# Patient Record
Sex: Female | Born: 1985 | Race: White | Hispanic: Yes | Marital: Married | Smoking: Never smoker
Health system: Southern US, Community
[De-identification: ages and names within clinical notes are randomized; demographics above are authoritative.]

## PROBLEM LIST (undated history)

## (undated) DIAGNOSIS — I10 Essential (primary) hypertension: Secondary | ICD-10-CM

## (undated) HISTORY — PX: NO PAST SURGERIES: SHX2092

## (undated) HISTORY — DX: Essential (primary) hypertension: I10

---

## 2007-04-02 ENCOUNTER — Inpatient Hospital Stay (HOSPITAL_COMMUNITY): Admission: AD | Admit: 2007-04-02 | Discharge: 2007-04-05 | Payer: Self-pay | Admitting: Obstetrics & Gynecology

## 2007-04-02 ENCOUNTER — Encounter: Payer: Self-pay | Admitting: Obstetrics & Gynecology

## 2010-08-30 NOTE — H&P (Signed)
NAMEDISNEY, RUGGIERO          ACCOUNT NO.:  [id number]   MEDICAL RECORD NO.:  [id number]          PATIENT TYPE:  INP   LOCATION:  9170                          FACILITY:  [facility]   PHYSICIAN:  Roseanna Rainbow, M.D.DATE OF BIRTH:  07/22/1985   DATE OF ADMISSION:  04/02/2007  DATE OF DISCHARGE:                              HISTORY & PHYSICAL   CHIEF COMPLAINT:  The patient is a 25 year old gravida 1, para 0, with  an estimated date of confinement of April 20, 2007 with an intrauterine  pregnancy at 37 plus weeks with pregnancy-induced hypertension for  induction of labor.   HISTORY OF PRESENT ILLNESS:  The patient has been followed initially as  an inpatient and subsequently as an outpatient for mild preeclampsia.  She denies any neurologic symptoms.  An ultrasound on November 25 at 34  weeks, 3 days, the estimated fetal weight percentile was the 59th  percentile, no previa, normal amniotic fluid index.   ALLERGIES:  No known drug allergies.   MEDICATIONS:  Please see the medication reconciliation form.   OB RISK FACTORS:  Please see the above, urinary tract infection.   PRENATAL LABS:  Platelet count 216,000, hemoglobin 12.7, hematocrit  35.1, urine culture and sensitivity no growth.  GC probe negative.  Chlamydia probe negative.  One hour GCT 90.  HIV nonreactive.  Uric acid  was 4.3 on November 18.  Serum creatinine was 0.71 on March 05, 2007,  SGOT and SGPT were normal on March 18, 2007.  Rubella, immune, RPR  nonreactive.  Blood type is 0 positive, antibody screen negative.  Sickle cell is negative.  Urine protein, 24 hour, was 70 mg per day on  March 08, 2007.  Varicella immune.   PAST GYN HISTORY:  Noncontributory.   PAST MEDICAL HISTORY:  No significant history of medical diseases.   PAST SURGICAL HISTORY:  No previous surgery.   SOCIAL HISTORY:  She does secretarial work.  She is single, living with  her significant other.  Does not give any  significant history of alcohol  usage.  Has no significant smoking history.  Denies illicit drug use.   FAMILY HISTORY:  Brain cancer, adult onset diabetes.   PHYSICAL EXAMINATION:  VITAL SIGNS:  Blood pressure 150s/100, fetal  heart tracing reassuring, tocodynamometer- regular uterine contractions  by vaginal examination per the RN.   ASSESSMENT:  1. Intrauterine pregnancy at 37 weeks with pregnancy-induced      hypertension  2. Appropriate for gestational age fetus.   PLAN:  Admission, PIH panel, induction of labor.  We will start with low-  dose Pitocin per protocol.      Roseanna Rainbow, M.D.  Electronically Signed     LAJ/MEDQ  D:  04/02/2007  T:  04/02/2007  Job:  [id number]

## 2011-01-20 LAB — COMPREHENSIVE METABOLIC PANEL
ALT: 42 — ABNORMAL HIGH
ALT: 44 — ABNORMAL HIGH
AST: 43 — ABNORMAL HIGH
AST: 52 — ABNORMAL HIGH
Albumin: 2.1 — ABNORMAL LOW
Albumin: 2.6 — ABNORMAL LOW
Alkaline Phosphatase: 172 — ABNORMAL HIGH
Alkaline Phosphatase: 218 — ABNORMAL HIGH
BUN: 5 — ABNORMAL LOW
BUN: 8
CO2: 18 — ABNORMAL LOW
CO2: 22
Calcium: 7.2 — ABNORMAL LOW
Calcium: 8.6
Chloride: 103
Chloride: 105
Creatinine, Ser: 0.74
Creatinine, Ser: 0.81
GFR calc Af Amer: >60
GFR calc Af Amer: >60
GFR calc non Af Amer: >60
GFR calc non Af Amer: >60
Glucose, Bld: 110 — ABNORMAL HIGH
Glucose, Bld: 77
Potassium: 3.3 — ABNORMAL LOW
Potassium: 3.8
Sodium: 132 — ABNORMAL LOW
Sodium: 133 — ABNORMAL LOW
Total Bilirubin: 0.7
Total Bilirubin: 0.9
Total Protein: 5.2 — ABNORMAL LOW
Total Protein: 5.7 — ABNORMAL LOW

## 2011-01-20 LAB — URINALYSIS, DIPSTICK ONLY
Bilirubin Urine: NEGATIVE
Glucose, UA: NEGATIVE
Hgb urine dipstick: NEGATIVE
Ketones, ur: NEGATIVE
Leukocytes, UA: NEGATIVE
Nitrite: NEGATIVE
Protein, ur: NEGATIVE
Specific Gravity, Urine: <1.005 — ABNORMAL LOW
Urobilinogen, UA: 0.2
pH: 7

## 2011-01-20 LAB — CBC
HCT: 30.5 — ABNORMAL LOW
HCT: 35.5 — ABNORMAL LOW
HCT: 37.1
Hemoglobin: 11.1 — ABNORMAL LOW
Hemoglobin: 12.8
Hemoglobin: 13.2
MCHC: 35.5
MCHC: 36
MCHC: 36.3 — ABNORMAL HIGH
MCV: 92.2
MCV: 92.5
MCV: 92.7
Platelets: 115 — ABNORMAL LOW
Platelets: 118 — ABNORMAL LOW
Platelets: 124 — ABNORMAL LOW
RBC: 3.29 — ABNORMAL LOW
RBC: 3.86 — ABNORMAL LOW
RBC: 4.01
RDW: 13.5
RDW: 13.6
RDW: 13.7
WBC: 10.4
WBC: 13.4 — ABNORMAL HIGH
WBC: 15.8 — ABNORMAL HIGH

## 2011-01-20 LAB — URIC ACID
Uric Acid, Serum: 5.3
Uric Acid, Serum: 5.8

## 2011-01-20 LAB — MAGNESIUM: Magnesium: 6.4

## 2011-01-20 LAB — RPR: RPR Ser Ql: NONREACTIVE

## 2011-01-20 LAB — LACTATE DEHYDROGENASE
LDH: 176
LDH: 231

## 2011-04-18 NOTE — L&D Delivery Note (Signed)
Delivery Note Progressed quickly to 9cm then complete dilation. By the time the CRNA got into the room to dose the epidural, the patient was pushing involuntarily.  At 3:38 AM a viable and healthy female Regan Rakers was delivered via  (Presentation: LOA with compound hand). No difficulty with shoulders.   APGAR: 9/9 , ; weight 5+14 .    Placenta status: spontaneous and grossly intact with 3VC.  Placenta had a circumvallate appearance and cord was short, approximately 10-12 inches. Sent to pathology.  Anesthesia:  Dry cath, not able to be dosed  Episiotomy: None Lacerations: None Suture Repair:  Est. Blood Loss (150 mL):   Mom to AICU.  Baby to nursery-stable.  [facility] 02/16/2012, 3:55 AM

## 2011-08-16 ENCOUNTER — Encounter: Payer: Self-pay | Admitting: *Deleted

## 2011-08-16 ENCOUNTER — Encounter: Payer: Self-pay | Admitting: Family

## 2011-08-16 ENCOUNTER — Ambulatory Visit (INDEPENDENT_AMBULATORY_CARE_PROVIDER_SITE_OTHER): Payer: Self-pay | Admitting: Advanced Practice Midwife

## 2011-08-16 ENCOUNTER — Telehealth: Payer: Self-pay | Admitting: *Deleted

## 2011-08-16 VITALS — BP 118/83 | Temp 97.7°F | Ht 64.0 in | Wt 134.0 lb

## 2011-08-16 DIAGNOSIS — Z3480 Encounter for supervision of other normal pregnancy, unspecified trimester: Secondary | ICD-10-CM

## 2011-08-16 DIAGNOSIS — O209 Hemorrhage in early pregnancy, unspecified: Secondary | ICD-10-CM

## 2011-08-16 DIAGNOSIS — Z3490 Encounter for supervision of normal pregnancy, unspecified, unspecified trimester: Secondary | ICD-10-CM

## 2011-08-16 DIAGNOSIS — O09299 Supervision of pregnancy with other poor reproductive or obstetric history, unspecified trimester: Secondary | ICD-10-CM

## 2011-08-16 LAB — POCT URINALYSIS DIP (DEVICE)
Bilirubin Urine: NEGATIVE
Glucose, UA: NEGATIVE mg/dL
Hgb urine dipstick: NEGATIVE
Ketones, ur: NEGATIVE mg/dL
Leukocytes, UA: NEGATIVE
Nitrite: NEGATIVE
Protein, ur: NEGATIVE mg/dL
Specific Gravity, Urine: 1.02 (ref 1.005–1.030)
Urobilinogen, UA: 0.2 mg/dL (ref 0.0–1.0)
pH: 6 (ref 5.0–8.0)

## 2011-08-16 NOTE — Telephone Encounter (Addendum)
Need to schedule first trimester screen if viability ultrasound shows viable fetus on 08/18/11. Per ultrasound review from 08/18/11 viable fetus verified. Called [facility]FM and sent order , appointment scheduled. Need to call patient  With interpreter and give appointment for MFM first screen- 08/23/11 at 0930 ( patient is self pay- explain will be billed)

## 2011-08-16 NOTE — Progress Notes (Signed)
Pulse- 92  No vaginal discharge but when wipe has like "blood tinge on tissue".  Pt informed of 25-35lb weight gain according to BMI. Education booklet given to pt.   Declines WIC info.

## 2011-08-16 NOTE — Progress Notes (Signed)
   Subjective:    Andrea Singh is a G2P0101 [redacted]w[redacted]d being seen today for her first obstetrical visit.  Her obstetrical history is significant for History of preeclampsia with 32 week delivery first pregnancy. Patient does intend to breast feed. Pregnancy history fully reviewed.  Patient reports spotting last week.  Filed Vitals:   08/16/11 0826 08/16/11 0830  BP: 118/83   Temp: 97.7 F (36.5 C)   Height:  5\' 4"  (1.626 m)  Weight: 134 lb (60.782 kg)     HISTORY: OB History    Grav Para Term Preterm Abortions TAB SAB Ect Mult Living   2 1  1      1      # Outc Date GA Lbr Len/2nd Wgt Sex Del Anes PTL Lv   1 PRE 12/08 [redacted]w[redacted]d  6lb(2.722kg) M SVD EPI Yes Yes   Comments: elevated blood sugars   2 CUR              Past Medical History  Diagnosis Date  . Hypertension     with preg in 2008   History reviewed. No pertinent past surgical history. History reviewed. No pertinent family history.   Exam    Uterus:     Pelvic Exam:    Perineum: No Hemorrhoids   Vulva: normal   Vagina:  normal mucosa   pH:    Cervix: friabl   Adnexa: normal adnexa and no mass, fullness, tenderness   Bony Pelvis: gynecoid  System: Breast:  normal appearance, no masses or tenderness   Skin: normal coloration and turgor, no rashes    Neurologic: oriented, normal   Extremities: normal strength, tone, and muscle mass   HEENT    Mouth/Teeth mucous membranes moist, pharynx normal without lesions   Neck supple and no masses   Cardiovascular: regular rate and rhythm   Respiratory:  appears well, vitals normal, no respiratory distress, acyanotic, normal RR, ear and throat exam is normal, neck free of mass or lymphadenopathy, chest clear, no wheezing, crepitations, rhonchi, normal symmetric air entry   Abdomen: soft, non-tender; bowel sounds normal; no masses,  no organomegaly   Urinary: urethral meatus normal   Unable to hear FHTs Uterus retroverted   Assessment:    Pregnancy:  [id number] Patient Active Problem List  Diagnoses  . Supervision of normal pregnancy  . Hx of preeclampsia, prior pregnancy, currently pregnant        Plan:     Initial labs drawn. Prenatal vitamins. Problem list reviewed and updated. Genetic Screening discussed Integrated Screen: ordered.  Ultrasound discussed; fetal survey: ordered.  Follow up in 4 weeks. 50% of 30 min visit spent on counseling and coordination of care.  Will get [place] to determine dates and viability due to inability to hear FHTs and report of bleeding.    [facility] 08/16/2011

## 2011-08-17 LAB — OBSTETRIC PANEL
Antibody Screen: NEGATIVE
Basophils Absolute: 0 10*3/uL (ref 0.0–0.1)
Basophils Relative: 0 % (ref 0–1)
Eosinophils Absolute: 0 10*3/uL (ref 0.0–0.7)
Eosinophils Relative: 0 % (ref 0–5)
HCT: 37.8 % (ref 36.0–46.0)
Hemoglobin: 13.3 g/dL (ref 12.0–15.0)
Hepatitis B Surface Ag: NEGATIVE
Lymphocytes Relative: 27 % (ref 12–46)
Lymphs Abs: 1.9 10*3/uL (ref 0.7–4.0)
MCH: 30 pg (ref 26.0–34.0)
MCHC: 35.2 g/dL (ref 30.0–36.0)
MCV: 85.3 fL (ref 78.0–100.0)
Monocytes Absolute: 0.4 10*3/uL (ref 0.1–1.0)
Monocytes Relative: 5 % (ref 3–12)
Neutro Abs: 4.7 10*3/uL (ref 1.7–7.7)
Neutrophils Relative %: 67 % (ref 43–77)
Platelets: 182 10*3/uL (ref 150–400)
RBC: 4.43 MIL/uL (ref 3.87–5.11)
RDW: 12.4 % (ref 11.5–15.5)
Rh Type: POSITIVE
Rubella: 79.2 IU/mL — ABNORMAL HIGH
WBC: 7 10*3/uL (ref 4.0–10.5)

## 2011-08-17 LAB — GLUCOSE TOLERANCE, 1 HOUR: Glucose, 1 Hour GTT: 106 mg/dL (ref 70–140)

## 2011-08-17 LAB — GC/CHLAMYDIA PROBE AMP, GENITAL
Chlamydia, DNA Probe: NEGATIVE
GC Probe Amp, Genital: NEGATIVE

## 2011-08-17 LAB — HIV ANTIBODY (ROUTINE TESTING W REFLEX): HIV: NONREACTIVE

## 2011-08-18 ENCOUNTER — Ambulatory Visit (HOSPITAL_COMMUNITY)
Admission: RE | Admit: 2011-08-18 | Discharge: 2011-08-18 | Disposition: A | Discharge status: Home / Self Care | Payer: Medicaid Other | Source: Ambulatory Visit | Attending: Advanced Practice Midwife | Admitting: Advanced Practice Midwife

## 2011-08-18 DIAGNOSIS — O09299 Supervision of pregnancy with other poor reproductive or obstetric history, unspecified trimester: Secondary | ICD-10-CM | POA: Insufficient documentation

## 2011-08-18 DIAGNOSIS — O209 Hemorrhage in early pregnancy, unspecified: Secondary | ICD-10-CM | POA: Insufficient documentation

## 2011-08-18 DIAGNOSIS — O3680X0 Pregnancy with inconclusive fetal viability, not applicable or unspecified: Secondary | ICD-10-CM | POA: Insufficient documentation

## 2011-08-18 LAB — HEMOGLOBINOPATHY EVALUATION
Hemoglobin Other: 0 %
Hgb A2 Quant: 1.5 % — ABNORMAL LOW (ref 2.2–3.2)
Hgb A: 98.5 % — ABNORMAL HIGH (ref 96.8–97.8)
Hgb F Quant: 0 % (ref 0.0–2.0)
Hgb S Quant: 0 %

## 2011-08-19 LAB — CULTURE, OB URINE: Colony Count: 10000

## 2011-08-21 NOTE — Telephone Encounter (Signed)
Called patient with interpreter Alejandra , informed her the provider wanted [place] to schedule first screen- explained to patient and appointment given, also explained to her she would not have to pay the whole cost upfront  at the appointment- would get a bill and could make payment schedule. Patient voices understanding.

## 2011-08-21 NOTE — Telephone Encounter (Signed)
Appointment is 08/28/11 at 0930 not 08/24/11.

## 2011-08-28 ENCOUNTER — Ambulatory Visit (HOSPITAL_COMMUNITY)
Admission: RE | Admit: 2011-08-28 | Discharge: 2011-08-28 | Disposition: A | Discharge status: Home / Self Care | Payer: Medicaid Other | Source: Ambulatory Visit | Attending: Advanced Practice Midwife | Admitting: Advanced Practice Midwife

## 2011-08-28 ENCOUNTER — Other Ambulatory Visit: Payer: Self-pay

## 2011-08-28 VITALS — BP 112/72 | HR 80 | Wt 133.0 lb

## 2011-08-28 DIAGNOSIS — O209 Hemorrhage in early pregnancy, unspecified: Secondary | ICD-10-CM

## 2011-08-28 DIAGNOSIS — Z3689 Encounter for other specified antenatal screening: Secondary | ICD-10-CM | POA: Insufficient documentation

## 2011-08-28 DIAGNOSIS — O3510X0 Maternal care for (suspected) chromosomal abnormality in fetus, unspecified, not applicable or unspecified: Secondary | ICD-10-CM | POA: Insufficient documentation

## 2011-08-28 DIAGNOSIS — O351XX0 Maternal care for (suspected) chromosomal abnormality in fetus, not applicable or unspecified: Secondary | ICD-10-CM | POA: Insufficient documentation

## 2011-08-28 DIAGNOSIS — Z8751 Personal history of pre-term labor: Secondary | ICD-10-CM | POA: Insufficient documentation

## 2011-08-28 DIAGNOSIS — Z3480 Encounter for supervision of other normal pregnancy, unspecified trimester: Secondary | ICD-10-CM

## 2011-08-28 DIAGNOSIS — O09299 Supervision of pregnancy with other poor reproductive or obstetric history, unspecified trimester: Secondary | ICD-10-CM

## 2011-08-28 DIAGNOSIS — Z3490 Encounter for supervision of normal pregnancy, unspecified, unspecified trimester: Secondary | ICD-10-CM

## 2011-09-05 DIAGNOSIS — Z3490 Encounter for supervision of normal pregnancy, unspecified, unspecified trimester: Secondary | ICD-10-CM

## 2011-09-13 ENCOUNTER — Encounter: Payer: Self-pay | Admitting: Physician Assistant

## 2011-09-13 ENCOUNTER — Ambulatory Visit (INDEPENDENT_AMBULATORY_CARE_PROVIDER_SITE_OTHER): Payer: Self-pay | Admitting: Obstetrics and Gynecology

## 2011-09-13 VITALS — BP 117/72 | Temp 98.9°F | Wt 133.1 lb

## 2011-09-13 DIAGNOSIS — O209 Hemorrhage in early pregnancy, unspecified: Secondary | ICD-10-CM

## 2011-09-13 DIAGNOSIS — O09299 Supervision of pregnancy with other poor reproductive or obstetric history, unspecified trimester: Secondary | ICD-10-CM

## 2011-09-13 DIAGNOSIS — Z3480 Encounter for supervision of other normal pregnancy, unspecified trimester: Secondary | ICD-10-CM

## 2011-09-13 LAB — POCT URINALYSIS DIP (DEVICE)
Bilirubin Urine: NEGATIVE
Glucose, UA: NEGATIVE mg/dL
Hgb urine dipstick: NEGATIVE
Ketones, ur: NEGATIVE mg/dL
Nitrite: NEGATIVE
Protein, ur: NEGATIVE mg/dL
Specific Gravity, Urine: 1.025 (ref 1.005–1.030)
Urobilinogen, UA: 0.2 mg/dL (ref 0.0–1.0)
pH: 6 (ref 5.0–8.0)

## 2011-09-13 NOTE — Progress Notes (Signed)
Doing well. Will get anatomy US and MSAFP next. Revised OB Hx (IOL 37 plus wks for preE)

## 2011-09-13 NOTE — Patient Instructions (Signed)
Embarazo - Segundo trimestre (Pregnancy - Second Trimester) El segundo trimestre del embarazo (del 3 al 6mes) es un perodo de evolucin rpida para usted y el beb. Hacia el final del sexto mes, el beb mide aproximadamente 23 cm y pesa 680 g. Comenzar a sentir los movimientos del beb entre las 18 y las 20 semanas de embarazo. Podr sentir las pataditas ("quickening en ingls"). Hay un rpido aumento de peso. Puede segregar un lquido claro (calostro) de las mamas. Quizs sienta pequeas contracciones en el vientre (tero) Esto se conoce como falso trabajo de parto o contracciones de Braxton-Hicks. Es como una prctica del trabajo de parto que se produce cuando el beb est listo para salir. Generalmente los problemas de vmitos matinales ya se han superado hacia el final del primer trimestre. Algunas mujeres desarrollan pequeas manchas oscuras (que se denominan cloasma, mscara del embarazo) en la cara que normalmente se van luego del nacimiento del beb. La exposicin al sol empeora las manchas. Puede desarrollarse acn en algunas mujeres embarazadas, y puede desaparecer en aquellas que ya tienen acn. EXAMENES PRENATALES  Durante los exmenes prenatales, deber seguir realizando pruebas de sangre, segn avance el embarazo. Estas pruebas se realizan para controlar su salud y la del beb. Tambin se realizan anlisis de sangre para conocer los niveles de hemoglobina. La anemia (bajo nivel de hemoglobina) es frecuente durante el embarazo. Para prevenirla, se administran hierro y vitaminas. Tambin se le realizarn exmenes para saber si tiene diabetes entre las 24 y las 28 semanas del embarazo. Podrn repetirle algunas de las pruebas que le hicieron previamente.   En cada visita le medirn el tamao del tero. Esto se realiza para asegurarse de que el beb est creciendo correctamente de acuerdo al estado del embarazo.   Tambin en cada visita prenatal controlarn su presin arterial. Esto se realiza  para asegurarse de que no tenga toxemia.   Se controlar su orina para asegurarse de que no tenga infecciones, diabetes o protena en la orina.   Se controlar su peso regularmente para asegurarse que el aumento ocurre al ritmo indicado. Esto se hace para asegurarse que usted y el beb tienen una evolucin normal.   En algunas ocasiones se realiza una prueba de ultrasonido para confirmar el correcto desarrollo y evolucin del beb. Esta prueba se realiza con ondas sonoras inofensivas para el beb, de modo que el profesional pueda calcular ms precisamente la fecha del parto.  Algunas veces se realizan pruebas especializadas del lquido amnitico que rodea al beb. Esta prueba se denomina amniocentesis. El lquido amnitico se obtiene introduciendo una aguja en el vientre (abdomen). Se realiza para controlar los cromosomas en aquellos casos en los que existe alguna preocupacin acerca de algn problema gentico que pueda sufrir el beb. En ocasiones se lleva a cabo cerca del final del embarazo, si es necesario inducir al parto. En este caso se realiza para asegurarse que los pulmones del beb estn lo suficientemente maduros como para que pueda vivir fuera del tero. CAMBIOS QUE OCURREN EN EL SEGUNDO TRIMESTRE DEL EMBARAZO Su organismo atravesar numerosos cambios durante el embarazo. Estos pueden variar de una persona a otra. Converse con el profesional que la asiste acerca los cambios que usted note y que la preocupen.  Durante el segundo trimestre probablemente sienta un aumento del apetito. Es normal tener "antojos" de ciertas comidas. Esto vara de una persona a otra y de un embarazo a otro.   El abdomen inferior comenzar a abultarse.   Podr tener la necesidad   de orinar con ms frecuencia debido a que el tero y el beb presionan sobre la vejiga. Tambin es frecuente contraer ms infecciones urinarias durante el embarazo (dolor al orinar). Puede evitarlas bebiendo gran cantidad de lquidos y  vaciando la vejiga antes y despus de mantener relaciones sexuales.   Podrn aparecer las primeras estras en las caderas, abdomen y mamas. Estos son cambios normales del cuerpo durante el embarazo. No existen medicamentos ni ejercicios que puedan prevenir estos cambios.   Es posible que comience a desarrollar venas inflamadas y abultadas (varices) en las piernas. El uso de medias de descanso, elevar sus pies durante 15 minutos, 3 a 4 veces al da y limitar la sal en su dieta ayuda a aliviar el problema.   Podr sentir acidez gstrica a medida que el tero crece y presiona contra el estmago. Puede tomar anticidos, con la autorizacin de su mdico, para aliviar este problema. Tambin es til ingerir pequeas comidas 4 a 5 veces al da.   La constipacin puede tratarse con un laxante o agregando fibra a su dieta. Beber grandes cantidades de lquidos, comer vegetales, frutas y granos integrales es de gran ayuda.   Tambin es beneficioso practicar actividad fsica. Si ha sido una persona activa hasta el embarazo, podr continuar con la mayora de las actividades durante el mismo. Si ha sido menos activa, puede ser beneficioso que comience con un programa de ejercicios, como realizar caminatas.   Puede desarrollar hemorroides (vrices en el recto) hacia el final del segundo trimestre. Tomar baos de asiento tibios y utilizar cremas recomendadas por el profesional que lo asiste sern de ayuda para los problemas de hemorroides.   Tambin podr sentir dolor de espalda durante este momento de su embarazo. Evite levantar objetos pesados, utilice zapatos de taco bajo y mantenga una buena postura para ayudar a reducir los problemas de espalda.   Algunas mujeres embarazadas desarrollan hormigueo y adormecimiento de la mano y los dedos debido a la hinchazn y compresin de los ligamentos de la mueca (sndrome del tnel carpiano). Esto desaparece una vez que el beb nace.   Como sus pechos se agrandan,  necesitar un sujetador ms grande. Use un sostn de soporte, cmodo y de algodn. No utilice un sostn para amamantar hasta el ltimo mes de embarazo si va a amamantar al beb.   Podr observar una lnea oscura desde el ombligo hacia la zona pbica denominada linea nigra.   Podr observar que sus mejillas se ponen coloradas debido al aumento de flujo sanguneo en la cara.   Podr desarrollar "araitas" en la cara, cuello y pecho. Esto desaparece una vez que el beb nace.  INSTRUCCIONES PARA EL CUIDADO DOMICILIARIO  Es extremadamente importante que evite el cigarrillo, hierbas medicinales, alcohol y las drogas no prescriptas durante el embarazo. Estas sustancias qumicas afectan la formacin y el desarrollo del beb. Evite estas sustancias durante todo el embarazo para asegurar el nacimiento de un beb sano.   La mayor parte de los cuidados que se aconsejan son los mismos que los indicados para el primer trimestre del embarazo. Cumpla con las citas tal como se le indic. Siga las instrucciones del profesional que lo asiste con respecto al uso de los medicamentos, el ejercicio y la dieta.   Durante el embarazo debe obtener nutrientes para usted y para su beb. Consuma alimentos balanceados a intervalos regulares. Elija alimentos como carne, pescado, leche y otros productos lcteos descremados, vegetales, frutas, panes integrales y cereales. El profesional le informar cul es el   aumento de peso ideal.   Las relaciones sexuales fsicas pueden continuarse hasta cerca del fin del embarazo si no existen otros problemas. Estos problemas pueden ser la prdida temprana (prematura) de lquido amnitico de las membranas, sangrado vaginal, dolor abdominal u otros problemas mdicos o del embarazo.   Realice actividad fsica todos los das, si no tiene restricciones. Consulte con el profesional que la asiste si no sabe con certeza si determinados ejercicios son seguros. El mayor aumento de peso tiene lugar  durante los ltimos 2 trimestres del embarazo. El ejercicio la ayudar a:   Controlar su peso.   Ponerla en forma para el parto.   Ayudarla a perder peso luego de haber dado a luz.   Use un buen sostn o como los que se usan para hacer deportes para aliviar la sensibilidad de las mamas. Tambin puede serle til si lo usa mientras duerme. Si pierde calostro, podr utilizar apsitos en el sostn.   No utilice la baera con agua caliente, baos turcos y saunas durante el embarazo.   Utilice el cinturn de seguridad sin excepcin cuando conduzca. Este la proteger a usted y al beb en caso de accidente.   Evite comer carne cruda, queso crudo, y el contacto con los utensilios y desperdicios de los gatos. Estos elementos contienen grmenes que pueden causar defectos de nacimiento en el beb.   El segundo trimestre es un buen momento para visitar a su dentista y evaluar su salud dental si an no lo ha hecho. Es importante mantener los dientes limpios. Utilice un cepillo de dientes blando. Cepllese ms suavemente durante el embarazo.   Es ms fcil perder algo de orina durante el embarazo. Apretar y fortalecer los msculos de la pelvis la ayudar con este problema. Practique detener la miccin cuando est en el bao. Estos son los mismos msculos que necesita fortalecer. Son tambin los mismos msculos que utiliza cuando trata de evitar los gases. Puede practicar apretando estos msculos 10 veces, y repetir esto 3 veces por da aproximadamente. Una vez que conozca qu msculos debe apretar, no realice estos ejercicios durante la miccin. Puede favorecerle una infeccin si la orina vuelve hacia atrs.   Pida ayuda si tiene necesidades econmicas, de asesoramiento o nutricionales durante el embarazo. El profesional podr ayudarla con respecto a estas necesidades, o derivarla a otros especialistas.   La piel puede ponerse grasa. Si esto sucede, lvese la cara con un jabn suave, utilice un humectante no  graso y maquillaje con base de aceite o crema.  CONSUMO DE MEDICAMENTOS Y DROGAS DURANTE EL EMBARAZO  Contine tomando las vitaminas apropiadas para esta etapa tal como se le indic. Las vitaminas deben contener un miligramo de cido flico y deben suplementarse con hierro. Guarde todas las vitaminas fuera del alcance de los nios. La ingestin de slo un par de vitaminas o tabletas que contengan hierro puede ocasionar la muerte en un beb o en un nio pequeo.   Evite el uso de medicamentos, inclusive los de venta libre y hierbas que no hayan sido prescriptos o indicados por el profesional que la asiste. Algunos medicamentos pueden causar problemas fsicos al beb. Utilice los medicamentos de venta libre o de prescripcin para el dolor, el malestar o la fiebre, segn se lo indique el profesional que lo asiste. No utilice aspirina.   El consumo de alcohol est relacionado con ciertos defectos de nacimiento. Esto incluye el sndrome de alcoholismo fetal. Debe evitar el consumo de alcohol en cualquiera de sus formas. El cigarrillo   causa nacimientos prematuros y bebs de bajo peso. El uso de drogas recreativas est absolutamente prohibido. Son muy nocivas para el beb. Un beb que nace de una madre adicta, ser adicto al nacer. Ese beb tendr los mismos sntomas de abstinencia que un adulto.   Infrmele al profesional si consume alguna droga.   No consuma drogas ilegales. Pueden causarle mucho dao al beb.  SOLICITE ATENCIN MDICA SI: Tiene preguntas o preocupaciones durante su embarazo. Es mejor que llame para consultar las dudas que esperar hasta su prxima visita prenatal. De esta forma se sentir ms tranquila.  SOLICITE ATENCIN MDICA DE INMEDIATO SI:  La temperatura oral se eleva sin motivo por encima de 102 F (38.9 C) o segn le indique el profesional que lo asiste.   Tiene una prdida de lquido por la vagina (canal de parto). Si sospecha una ruptura de las membranas, tmese la  temperatura y llame al profesional para informarlo sobre esto.   Observa unas pequeas manchas, una hemorragia vaginal o elimina cogulos. Notifique al profesional acerca de la cantidad y de cuntos apsitos est utilizando. Unas pequeas manchas de sangre son algo comn durante el embarazo, especialmente despus de mantener relaciones sexuales.   Presenta un olor desagradable en la secrecin vaginal y observa un cambio en el color, de transparente a blanco.   Contina con las nuseas y no obtiene alivio de los remedios indicados. Vomita sangre o algo similar a la borra del caf.   [place] o sube ms de 900 g. en una semana, o segn lo indicado por el profesional que la asiste.   Observa que se le hinchan el rostro, las manos, los pies o las piernas.   Ha estado expuesta a la rubola y no ha sufrido la enfermedad.   Ha estado expuesta a la quinta enfermedad o a la varicela.   Presenta dolor abdominal. Las molestias en el ligamento redondo son una causa no cancerosa (benigna) frecuente de dolor abdominal durante el embarazo. El profesional que la asiste deber evaluarla.   Presenta dolor de cabeza intenso que no se alivia.   Presenta fiebre, diarrea, dolor al orinar o le falta la respiracin.   Presenta dificultad para ver, visin borrosa, o visin doble.   Sufre una cada, un accidente de trnsito o cualquier tipo de trauma.   Vive en un hogar en el que existe violencia fsica o mental.  Document Released: 01/11/2005 Document Revised: 03/23/2011 [employer] Patient Information 2012 [employer]. 

## 2011-09-13 NOTE — Progress Notes (Signed)
No vaginal discharge. Pulse 79

## 2011-10-11 ENCOUNTER — Ambulatory Visit (INDEPENDENT_AMBULATORY_CARE_PROVIDER_SITE_OTHER): Payer: Medicaid Other | Admitting: Advanced Practice Midwife

## 2011-10-11 ENCOUNTER — Ambulatory Visit (HOSPITAL_COMMUNITY)
Admission: RE | Admit: 2011-10-11 | Discharge: 2011-10-11 | Disposition: A | Discharge status: Home / Self Care | Payer: Medicaid Other | Source: Ambulatory Visit | Attending: Obstetrics and Gynecology | Admitting: Obstetrics and Gynecology

## 2011-10-11 ENCOUNTER — Encounter: Payer: Self-pay | Admitting: *Deleted

## 2011-10-11 VITALS — BP 110/74 | Temp 98.7°F | Wt 134.2 lb

## 2011-10-11 DIAGNOSIS — O209 Hemorrhage in early pregnancy, unspecified: Secondary | ICD-10-CM

## 2011-10-11 DIAGNOSIS — O09299 Supervision of pregnancy with other poor reproductive or obstetric history, unspecified trimester: Secondary | ICD-10-CM

## 2011-10-11 DIAGNOSIS — Z3689 Encounter for other specified antenatal screening: Secondary | ICD-10-CM | POA: Insufficient documentation

## 2011-10-11 LAB — POCT URINALYSIS DIP (DEVICE)
Bilirubin Urine: NEGATIVE
Glucose, UA: NEGATIVE mg/dL
Hgb urine dipstick: NEGATIVE
Ketones, ur: NEGATIVE mg/dL
Nitrite: NEGATIVE
Protein, ur: NEGATIVE mg/dL
Specific Gravity, Urine: 1.02 (ref 1.005–1.030)
Urobilinogen, UA: 1 mg/dL (ref 0.0–1.0)
pH: 7 (ref 5.0–8.0)

## 2011-10-11 MED ORDER — NITROFURANTOIN MONOHYD MACRO 100 MG PO CAPS: 100.0000 mg | ORAL_CAPSULE | Freq: Two times a day (BID) | ORAL | Status: AC

## 2011-10-11 NOTE — Progress Notes (Signed)
Pt reports burning with urination and mild back pain. +WBCs in urine.  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  Discussed frequent breaks for rest, pregnancy support band, Tylenol for pain.  Macrobid BID x7 days.  +FM, size =dates. Anticipatory guidance provided.

## 2011-10-11 NOTE — Patient Instructions (Addendum)
Embarazo - Segundo trimestre (Pregnancy - Second Trimester) El segundo trimestre del embarazo (del 3 al 6mes) es un perodo de evolucin rpida para usted y el beb. Hacia el final del sexto mes, el beb mide aproximadamente 23 cm y pesa 680 g. Comenzar a sentir los movimientos del beb entre las 18 y las 20 semanas de embarazo. Podr sentir las pataditas ("quickening en ingls"). Hay un rpido aumento de peso. Puede segregar un lquido claro (calostro) de las mamas. Quizs sienta pequeas contracciones en el vientre (tero) Esto se conoce como falso trabajo de parto o contracciones de [place]. Es como una prctica del trabajo de parto que se produce cuando el beb est listo para salir. Generalmente los problemas de vmitos matinales ya se han superado hacia el final del primer trimestre. Algunas mujeres desarrollan pequeas manchas oscuras (que se denominan cloasma, mscara del embarazo) en la cara que normalmente se van luego del nacimiento del beb. La exposicin al sol empeora las manchas. Puede desarrollarse acn en algunas mujeres embarazadas, y puede desaparecer en aquellas que ya tienen acn. EXAMENES PRENATALES  Durante los exmenes prenatales, deber seguir realizando pruebas de sangre, segn avance el embarazo. Estas pruebas se realizan para controlar su salud y la del beb. Tambin se realizan anlisis de sangre para conocer los niveles de hemoglobina. La anemia (bajo nivel de hemoglobina) es frecuente durante el embarazo. Para prevenirla, se administran hierro y vitaminas. Tambin se le realizarn exmenes para saber si tiene diabetes entre las 24 y las 28 semanas del embarazo. Podrn repetirle algunas de las pruebas que le hicieron previamente.  En cada visita le medirn el tamao del tero. Esto se realiza para asegurarse de que el beb est creciendo correctamente de acuerdo al estado del embarazo.  Tambin en cada visita prenatal controlarn su presin arterial. Esto se realiza  para asegurarse de que no tenga toxemia.  Se controlar su orina para asegurarse de que no tenga infecciones, diabetes o protena en la orina.  Se controlar su peso regularmente para asegurarse que el aumento ocurre al ritmo indicado. Esto se hace para asegurarse que usted y el beb tienen una evolucin normal.  En algunas ocasiones se realiza una prueba de ultrasonido para confirmar el correcto desarrollo y evolucin del beb. Esta prueba se realiza con ondas sonoras inofensivas para el beb, de modo que el profesional pueda calcular ms precisamente la fecha del parto. Algunas veces se realizan pruebas especializadas del lquido amnitico que rodea al beb. Esta prueba se denomina amniocentesis. El lquido amnitico se obtiene introduciendo una aguja en el vientre (abdomen). Se realiza para controlar los cromosomas en aquellos casos en los que existe alguna preocupacin acerca de algn problema gentico que pueda sufrir el beb. En ocasiones se lleva a cabo cerca del final del embarazo, si es necesario inducir al parto. En este caso se realiza para asegurarse que los pulmones del beb estn lo suficientemente maduros como para que pueda vivir fuera del tero. CAMBIOS QUE OCURREN EN EL SEGUNDO TRIMESTRE DEL EMBARAZO Su organismo atravesar numerosos cambios durante el embarazo. Estos pueden variar de una persona a otra. Converse con el profesional que la asiste acerca los cambios que usted note y que la preocupen.  Durante el segundo trimestre probablemente sienta un aumento del apetito. Es normal tener "antojos" de ciertas comidas. Esto vara de una persona a otra y de un embarazo a otro.  El abdomen inferior comenzar a abultarse.  Podr tener la necesidad de orinar con ms frecuencia debido a que   de orinar con ms frecuencia debido a que el tero y el beb presionan sobre la vejiga. Tambin es frecuente contraer ms infecciones urinarias durante el embarazo (dolor al [employer]). Puede evitarlas bebiendo gran cantidad de lquidos y  vaciando la vejiga antes y despus de Sales promotion account executive.   Podrn aparecer las primeras estras en las caderas, abdomen y [place]. Estos son cambios normales del cuerpo durante el [place]. No existen medicamentos ni ejercicios que puedan prevenir [employer].   Es posible que comience a desarrollar venas inflamadas y abultadas (varices) en las piernas. El uso de medias de   descanso, Optometrist sus pies durante 15 minutos, 3 a 4 veces al da y Film/video editor la sal en su dieta ayuda a Journalist, newspaper.   Podr sentir Engineering geologist gstrica a medida que el tero crece y Doctor, general practice. Puede tomar anticidos, con la autorizacin de su mdico, para Financial planner. Tambin es til ingerir pequeas comidas 4 a 5 veces al Futures trader.   La constipacin puede tratarse con un laxante o agregando fibra a su dieta. Beber grandes cantidades de lquidos, comer vegetales, frutas y granos integrales es de [place].   Tambin es beneficioso practicar actividad fsica. Si ha sido una persona Engineer, mining, podr continuar con la [employer] de las actividades durante el mismo. Si ha sido [employer], puede ser beneficioso que comience con un programa de ejercicios, Museum/gallery exhibitions officer.   Puede desarrollar hemorroides (vrices en el recto) hacia el final del segundo trimestre. Tomar baos de asiento tibios y Chemical engineer cremas recomendadas por el profesional que lo asiste sern de ayuda para los problemas de hemorroides.   Tambin podr Financial risk analyst de espalda durante este momento de su embarazo. Evite levantar objetos pesados, utilice zapatos de taco bajo y [place] buena postura para ayudar a reducir los problemas de [place].   Algunas mujeres embarazadas desarrollan hormigueo y adormecimiento de la mano y los dedos debido a la hinchazn y compresin de los ligamentos de la mueca (sndrome del tnel carpiano). Esto desaparece una vez que el beb nace.   Como sus pechos se agrandan,  Pension scheme manager un sujetador ms grande. Use un sostn de soporte, cmodo y de algodn. No utilice un sostn para amamantar hasta el ltimo mes de embarazo si va a amamantar al beb.   Podr observar una lnea oscura desde el ombligo hacia la zona pbica denominada linea nigra.   Podr observar que sus mejillas se ponen coloradas debido al aumento de flujo sanguneo en la cara.   Podr desarrollar "araitas" en la cara, cuello y pecho. Esto desaparece una vez que el beb nace.  INSTRUCCIONES PARA EL CUIDADO DOMICILIARIO  Es extremadamente importante que evite el cigarrillo, hierbas medicinales, alcohol y las drogas no prescriptas durante el Psychiatrist. Estas sustancias qumicas afectan la formacin y el desarrollo del beb. Evite estas sustancias durante todo el embarazo para asegurar el nacimiento de un beb sano.   La mayor parte de los cuidados que se aconsejan son los mismos que los indicados para Financial risk analyst trimestre del Psychiatrist. Cumpla con las citas tal como se le indic. Siga las instrucciones del profesional que lo asiste con respecto al uso de los medicamentos, el ejercicio y Psychologist, forensic.   Durante el embarazo debe obtener nutrientes para usted y para su beb. Consuma alimentos balanceados a intervalos regulares. Elija alimentos como carne, pescado, [place] y otros productos lcteos descremados, vegetales, frutas, panes integrales y cereales. El Equities trader cul  es el aumento de peso ideal.   Las relaciones sexuales fsicas pueden continuarse hasta cerca del fin del embarazo si no existen otros problemas. Estos problemas pueden ser la prdida temprana (prematura) de lquido amnitico de las [place], sangrado vaginal, dolor abdominal u otros problemas mdicos o del Psychiatrist.   Realice [employer], si no tiene restricciones. Consulte con el profesional que la asiste si no sabe con certeza si determinados ejercicios son seguros. El mayor aumento de peso tiene Environmental consultant  durante los ltimos 2 trimestres del Psychiatrist. El ejercicio la ayudar a:   Engineering geologist.   Ponerla en forma para el parto.   Ayudarla a perder peso luego de haber dado a luz.   Use un buen sostn o como los que se usan para hacer deportes para Paramedic la sensibilidad de las [place]. Tambin puede serle til si lo [place] mientras duerme. Si pierde Product manager, podr [employer].   No utilice la baera con agua caliente, baos turcos y saunas durante el [address]   Utilice el cinturn de seguridad sin excepcin cuando conduzca. Este la proteger a usted y al beb en caso de accidente.   Evite comer carne cruda, queso crudo, y el contacto con los utensilios y desperdicios de los gatos. Estos elementos contienen grmenes que pueden causar defectos de nacimiento en el beb.   El segundo trimestre es un buen momento para visitar a su dentista y Software engineer si an no lo ha hecho. Es Primary school teacher los dientes limpios. Utilice un cepillo de dientes blando. Cepllese ms suavemente durante el embarazo.   Es ms fcil perder algo de orina durante el [place]. Apretar y Chief Operating Officer los msculos de la pelvis la ayudar con este problema. Practique detener la miccin cuando est en el bao. Estos son los mismos msculos que Development worker, international aid. Son [employer] mismos msculos que utiliza cuando trata de [employer] gases. Puede practicar apretando estos msculos 10 veces, y repetir esto 3 veces por da aproximadamente. Una vez que conozca qu msculos debe apretar, no realice estos ejercicios durante la miccin. Puede favorecerle una infeccin si la orina vuelve hacia atrs.   Pida ayuda si tiene necesidades econmicas, de asesoramiento o nutricionales durante el [place]. El profesional podr ayudarla con respecto a estas necesidades, o derivarla a otros especialistas.   La piel puede ponerse grasa. Si esto sucede, lvese la cara con un jabn [place], utilice un humectante no  graso y [place] con base de aceite o crema.  CONSUMO DE MEDICAMENTOS Y DROGAS DURANTE EL EMBARAZO  Contine tomando las vitaminas apropiadas para esta etapa tal como se le indic. Las vitaminas deben contener un miligramo de cido flico y deben suplementarse con hierro. Guarde todas las vitaminas fuera del alcance de los nios. La ingestin de slo un par de vitaminas o tabletas que contengan hierro puede ocasionar la [employer] en un beb o en un nio pequeo.   Evite el uso de [place], inclusive los de venta libre y hierbas que no hayan sido prescriptos o indicados por el profesional que la asiste. Algunos medicamentos pueden causar problemas fsicos al beb. Utilice los medicamentos de venta libre o de prescripcin para Chief Technology Officer, Environmental health practitioner o la [place], segn se lo indique el profesional que lo asiste. No utilice aspirina.   El consumo de alcohol est relacionado con ciertos defectos de nacimiento. Esto incluye el sndrome de alcoholismo fetal. Debe evitar el consumo de alcohol en cualquiera de sus formas.  El cigarrillo causa nacimientos prematuros y bebs de [place]. El uso de drogas recreativas est absolutamente prohibido. Son muy nocivas para el beb. Un beb que nace de [employer], ser adicto al nacer. Ese beb tendr los mismos sntomas de abstinencia que un adulto.   Infrmele al profesional si consume alguna droga.   No consuma drogas ilegales. Pueden causarle mucho dao al beb.  SOLICITE ATENCIN MDICA SI: Tiene preguntas o preocupaciones durante su embarazo. Es mejor que llame para Science writer las dudas que esperar hasta su prxima visita prenatal. Thressa Sheller forma se sentir ms tranquila.  SOLICITE ATENCIN MDICA DE INMEDIATO SI:  La temperatura oral se eleva sin motivo por encima de 102 F (38.9 C) o segn le indique el profesional que lo asiste.   Tiene una prdida de lquido por la vagina (canal de parto). Si sospecha una ruptura de las [place], tmese la  temperatura y llame al profesional para informarlo sobre esto.   Observa unas pequeas manchas, una hemorragia vaginal o elimina cogulos. Notifique al profesional acerca de la cantidad y de cuntos apsitos est utilizando. Unas pequeas manchas de sangre son algo comn durante el Psychiatrist, especialmente despus de Sales promotion account executive.   Presenta un olor desagradable en la secrecin vaginal y observa un cambio en el color, de transparente a blanco.   Contina con las nuseas y no obtiene alivio de los remedios indicados. Vomita sangre o algo similar a la borra del caf.   [place] o sube ms de 900 g. en una semana, o segn lo indicado por el profesional que la asiste.   Observa que se le [employer], las manos, los pies o las piernas.   Ha estado expuesta a la rubola y no ha sufrido la enfermedad.   Ha estado expuesta a la quinta enfermedad o a la varicela.   Presenta dolor abdominal. Las molestias en el ligamento redondo son Neomia Dear causa no cancerosa (benigna) frecuente de dolor abdominal durante el embarazo. El profesional que la asiste deber evaluarla.   Presenta dolor de cabeza intenso que no se [place].   Presenta fiebre, diarrea, dolor al orinar o le falta la respiracin.   Presenta dificultad para ver, visin borrosa, o visin doble.   Sufre una cada, un accidente de trnsito o cualquier tipo de trauma.   Vive en un hogar en el que existe violencia fsica o mental.  Document Released: 01/11/2005 Document Revised: 03/23/2011 [facility] Patient Information 2012 [place], [place]. Dolor de Merchandiser, retail  (Back Pain in Pregnancy)  El dolor de espalda es habitual durante el embarazo. Ocurre en aproximadamente la mitad de todos los [place]. Es importante para usted y su beb que permanezca activa durante el [place].Si siente que Chief Technology Officer de espalda es lo que no le permite mantenerse activa o dormir bien, Scientist, clinical (histocompatibility and immunogenetics) a su mdico. La causa del dolor de  espalda puede deberse a varios factores relacionados con los cambios durante el [place].Afortunadamente, excepto que haya tenido problemas de espalda antes del [place], es probable que el dolor mejore despus del [place]. El dolor lumbar por lo general ocurre entre el quinto y sptimo mes del Psychiatrist. Sin embargo, puede ocurrir [employer] primeros meses. Otros factores que aumentan el riesgo son:   Problemas previos en la espalda.   Lesiones en la espalda.   Tener gemelos o embarazos mltiples.   Tos persistente.   El estrs.   Movimientos repetitivos relacionados con Kathie Dike.   Enfermedad muscular o de  la columna vertebral en la espalda.   Antecedentes familiares de problemas de espalda, rotura (hernia) de discos u osteoporosis.   Depresin, ansiedad y crisis de [place].  CAUSAS   En las embarazadas, el cuerpo produce una hormona llamada relaxina. Esta hormona hace que los ligamentos que conectan la zona lumbar y los huesos del pubis sean ms flexibles. Esta flexibilidad permite que el beb nazca con ms facilidad. Cuando los ligamentos estn relajados, los msculos tienen que trabajar ms para apoyar la espalda. El dolor en la espalda puede deberse al cansancio muscular. El dolor tambin puede tener su causa en la irritacin de los tejidos de a espalda que se irritan ya que estn recibiendo menos apoyo.   A medida que el beb crece, ejerce presin [employer] nervios y los vasos sanguneos de la pelvis. Esto causa dolor de espalda.   A medida que el beb crece y [address] durante el [place], el tero presiona los msculos del estmago hacia adelante y [place] su centro de gravedad. Esto hace que los msculos de la espalda deban trabajar ms para mantener una buena [place].  SNTOMAS  Dolor lumbar durante el embarazo Generalmente se produce en la zona o por arriba de la cintura en el centro de la espalda. Puede haber dolor y entumecimiento que se irradia hacia la pierna o el  pie. Es similar al dolor de espalda baja experimentada por las mujeres no embarazadas. Por lo general, aumenta al [employer] de pie o sentada por largos perodos de [place], o con levantamientos repetitivos Tambin puede haber sensibilidad en los msculos en la zona superior de la espalda .  Dolor plvico posterior Environmental consultant en la parte posterior de la pelvis es ms frecuente que el dolor lumbar en el embarazo. Se trata de un dolor profundo que se siente a un lado en la cintura, o a travs del cxis (sacro), o en ambos lugares. Puede sentir dolor en uno o ambos lados Este dolor tambin puede sentirse en las nalgas y el dorso de los muslos Tambin puede haber dolor pbico y en la ingle. El dolor no se mejora rpidamente con el reposo, y [place] puede haber rigidez matutina. Muchas actividades pueden causarlo. Un buen estado fsico antes y [address] puede o no prevenir este problema. Las contracciones del parto suelen aparecer cada 1 a 2 minutos, tienen una duracin de aproximadamente 1 minuto, e implica una sensacin de empujar o presin en la pelvis. Sin embargo, si usted est a trmino con Firefighter, Chief Technology Officer constante en la zona lumbar puede indicar el comienzo de un parto prematuro, y usted debe ser consciente de ello.  DIAGNSTICO  No se deben tomar radiografas de la [employer] las primeras 12 a 14 semanas del embarazo y durante el resto del Psychiatrist, slo cuando sea absolutamente necesario. La resonancia magntica no emite radiacin y es un estudio seguro durante el Psychiatrist. Pero tambin se deben hacer solamente cuando sea absolutamente necesario.  INSTRUCCIONES PARA EL CUIDADO EN EL HOGAR   Realice actividad fsica segn las indicaciones del mdico. El ejercicio es la manera ms eficaz para prevenir o tratar Chief Technology Officer de espalda. Si tiene un problema en la espalda, es especialmente importante evitar los deportes que requieran de movimientos corporales rpidos. La  natacin y las caminatas son las mejores [address].   No permanezca sentada o de pie en el mismo lugar durante largos perodos.   No use tacos altos.   Sintese en la silla  con Nena Polio. Use una almohada en su espalda baja si es necesario. Asegrese de que su cabeza descansa sobre sus hombros y no est colgando hacia delante.   Trate de dormir de lado, de preferencia el lado izquierdo, con una o [employer] piernas. Si est dolorida despus de una noche de descanso, la cama puede ser [employer].Trate de colocar una tabla entre el colchn y el somier.   Prstele atencin a su cuerpo cuando se levante.Si siente dolor,pida ayuda o trate de doblar las rodillas ms para [employer] de las piernas en lugar de los msculos de la espalda. Pngase en cuclillas al levantar algo del suelo. No se doble.   Consuma una dieta saludable. Trate de aumentar de peso dentro de las recomendaciones de su mdico.   Utilice compresas de calor o fro de 3 a 4 veces al da durante 15 minutos para Primary school teacher.   Solo tome medicamentos que se pueden comprar sin receta o recetados para Chief Technology Officer, Dentist o fiebre, como le indica el mdico.  Dolor de espaldas repentino (agudo).  Haga reposo en cama slo en caso de los episodios ms extremos y agudos de Engineer, mining. El reposo prolongado en cama de ms de 48 horas agravar su trastorno.   El hielo es muy efectivo en los problemas agudos.   Ponga el hielo en una bolsa plstica.   Colquese una toalla entre la piel y la bolsa de hielo.   Deje el hielo durante 10 a 20 minutos cada 2 horas o segn lo nesecite, mientras se encuentre despierta.   Las compresas de calor durante 30 minutos antes de las actividades tambin puede ayudar.  Dolor crnico en la espalda. Consulte a su mdico si el dolor es continuo. El mdico podr ayudarla o derivarla para que realice los ejercicios y trabajos de fortalecimiento apropiados. Con un buen  entrenamiento fsico, podr evitar la mayor parte de los [place]. En algunos casos, la causa es un problema ms grave. Debe ser controlada inmediatamente si aparecen nuevos problemas. El mdico tambin podr recomendar:   Una faja de maternidad.   Un arns elstico.   Un cors para la espalda.   Un masajista o acupuntura.  SOLICITE ATENCIN MDICA SI:   No puede Careers information officer de sus actividades diarias, an tomando los medicamentos para Psychologist, occupational.   Beverlee Nims ser derivada a un fisioterapeuta o quiroprxico.   Beverlee Nims intentar con acupuntura.  SOLICITE ATENCIN MDICA DE INMEDIATO SI:   Siente entumecimiento, hormigueo, debilidad o problemas con el uso de los brazos o las piernas.   Siente un dolor de espalda muy intenso que no se alivia con medicamentos.   Tiene modificaciones repentinas en el control de la vejiga o el intestino.   Aumenta el dolor en otras partes del cuerpo.   Siente que le falta el aire, se marea o sufre un [place].   Tiene nuseas, vmitos o sudoracin.   Siente un dolor en la espalda similar al del [place] de [place].   Cuando aparece [employer], rompe la bolsa de aguas o tiene un sangrado vaginal.   El dolor o el adormecimiento se extienden hacia la pierna.   El dolor aparece despus de una cada.   Siente dolor de un solo lado. Podra tener clculos renales.   Observa sangre en la orina. Podra tener una infeccin en la vejiga o clculos renales.   Siente dolor y aparecen ronchas. Podra tener culebrilla.  El dolor de espalda es bastante frecuente durante el embarazo pero no debe aceptarse slo como parte del [place]. Siempre debe tratarse lo ms rpidamente posible. Har que su embarazo sea lo ms placentero posible.  Document Released: 12/14/2010 Document Revised: 03/23/2011 [facility] Patient Information 2012 [place], [place].

## 2011-10-11 NOTE — Progress Notes (Signed)
Pulse: 76

## 2011-11-08 ENCOUNTER — Encounter: Payer: Self-pay | Admitting: *Deleted

## 2011-11-08 ENCOUNTER — Encounter: Payer: Self-pay | Admitting: Advanced Practice Midwife

## 2011-11-08 ENCOUNTER — Ambulatory Visit (INDEPENDENT_AMBULATORY_CARE_PROVIDER_SITE_OTHER): Payer: Medicaid Other | Admitting: Advanced Practice Midwife

## 2011-11-08 VITALS — BP 119/77 | Temp 98.0°F | Wt 139.1 lb

## 2011-11-08 DIAGNOSIS — O209 Hemorrhage in early pregnancy, unspecified: Secondary | ICD-10-CM

## 2011-11-08 DIAGNOSIS — K219 Gastro-esophageal reflux disease without esophagitis: Secondary | ICD-10-CM

## 2011-11-08 LAB — POCT URINALYSIS DIP (DEVICE)
Bilirubin Urine: NEGATIVE
Glucose, UA: NEGATIVE mg/dL
Hgb urine dipstick: NEGATIVE
Ketones, ur: NEGATIVE mg/dL
Leukocytes, UA: NEGATIVE
Nitrite: NEGATIVE
Protein, ur: NEGATIVE mg/dL
Specific Gravity, Urine: 1.02 (ref 1.005–1.030)
Urobilinogen, UA: 0.2 mg/dL (ref 0.0–1.0)
pH: 7 (ref 5.0–8.0)

## 2011-11-08 MED ORDER — RANITIDINE HCL 75 MG PO TABS: 75.0000 mg | ORAL_TABLET | Freq: Two times a day (BID) | ORAL | Status: DC

## 2011-11-08 NOTE — Progress Notes (Signed)
[id number] 

## 2011-11-08 NOTE — Progress Notes (Signed)
Doing well except for some acid reflux.  Recommend trying Tums and Zantac (Rx provided).  Plan to add Reglan if this does not help. Plan glucola at next visit.

## 2011-11-08 NOTE — Patient Instructions (Signed)
Gastroesophageal Reflux Disease, Adult Gastroesophageal reflux disease (GERD) happens when acid from your stomach flows up into the esophagus. When acid comes in contact with the esophagus, the acid causes soreness (inflammation) in the esophagus. Over time, GERD may create small holes (ulcers) in the lining of the esophagus. CAUSES   Increased body weight. This puts pressure on the stomach, making acid rise from the stomach into the esophagus.   Smoking. This increases acid production in the stomach.   Drinking alcohol. This causes decreased pressure in the lower esophageal sphincter (valve or ring of muscle between the esophagus and stomach), allowing acid from the stomach into the esophagus.   Late evening meals and a full stomach. This increases pressure and acid production in the stomach.   A malformed lower esophageal sphincter.  Sometimes, no cause is found. SYMPTOMS   Burning pain in the lower part of the mid-chest behind the breastbone and in the mid-stomach area. This may occur twice a week or more often.   Trouble swallowing.   Sore throat.   Dry cough.   Asthma-like symptoms including chest tightness, shortness of breath, or wheezing.  DIAGNOSIS  Your caregiver may be able to diagnose GERD based on your symptoms. In some cases, X-rays and other tests may be done to check for complications or to check the condition of your stomach and esophagus. TREATMENT  Your caregiver may recommend over-the-counter or prescription medicines to help decrease acid production. Ask your caregiver before starting or adding any new medicines.  HOME CARE INSTRUCTIONS   Change the factors that you can control. Ask your caregiver for guidance concerning weight loss, quitting smoking, and alcohol consumption.   Avoid foods and drinks that make your symptoms worse, such as:   Caffeine or alcoholic drinks.   Chocolate.   Peppermint or mint flavorings.   Garlic and onions.   Spicy foods.     Citrus fruits, such as oranges, lemons, or limes.   Tomato-based foods such as sauce, chili, salsa, and pizza.   Fried and fatty foods.   Avoid lying down for the 3 hours prior to your bedtime or prior to taking a nap.   Eat small, frequent meals instead of large meals.   Wear loose-fitting clothing. Do not wear anything tight around your waist that causes pressure on your stomach.   Raise the head of your bed 6 to 8 inches with wood blocks to help you sleep. Extra pillows will not help.   Only take over-the-counter or prescription medicines for pain, discomfort, or fever as directed by your caregiver.   Do not take aspirin, ibuprofen, or other nonsteroidal anti-inflammatory drugs (NSAIDs).  SEEK IMMEDIATE MEDICAL CARE IF:   You have pain in your arms, neck, jaw, teeth, or back.   Your pain increases or changes in intensity or duration.   You develop nausea, vomiting, or sweating (diaphoresis).   You develop shortness of breath, or you faint.   Your vomit is green, yellow, black, or looks like coffee grounds or blood.   Your stool is red, bloody, or black.  These symptoms could be signs of other problems, such as heart disease, gastric bleeding, or esophageal bleeding. MAKE SURE YOU:   Understand these instructions.   Will watch your condition.   Will get help right away if you are not doing well or get worse.  Document Released: 01/11/2005 Document Revised: 03/23/2011 Document Reviewed: 10/21/2010 [facility] Patient Information 2012 [place], [place].Reflujo gastroesofgico - Adultos  (Gastroesophageal Reflux Disease, Adult)  El reflujo gastroesofgico ocurre cuando el cido del estmago pasa al esfago. Cuando el cido entra en contacto con el esfago, el cido provoca dolor (inflamacin) en el esfago. Con el tiempo, pueden formarse pequeos agujeros (lceras) en el revestimiento del esfago. CAUSAS   Exceso de Runner, broadcasting/film/video. Esto aplica presin [employer], lo  que hace que el cido del estmago suba hacia el esfago.   El hbito de fumar [place] la produccin de cido en el [place].   El consumo de alcohol. Provoca disminucin de la presin en el esfnter esofgico inferior (vlvula o anillo de msculo entre el esfago y Investment banker, corporate), permitiendo que el cido del estmago suba hacia el esfago.   Cenas a ltima hora del da y estmago lleno. Aumenta la presin y la produccin de cido en el estmago.   Malformacin en el esfnter esofgico inferior.  A menudo no se halla causa.  SNTOMAS   Ardor y Radiographer, therapeutic parte inferior del pecho detrs del esternn y en la zona media del [place]. Puede ocurrir [employer] por semana o ms a menudo.   Dificultad para tragar.   Dolor de Advertising copywriter.   Tos seca.   Sntomas similares al asma que incluyen sensacin de opresin en el pecho, falta de aire y sibilancias.  DIAGNSTICO  El mdico diagnosticar el problema basndose en los sntomas. En algunos casos, se indican radiografas y otras pruebas para verificar si hay complicaciones o para comprobar el estado del [address] y Training and development officer.  TRATAMIENTO  El mdico le indicar medicamentos de venta libre o recetados para ayudar a disminuir la produccin de cido. Consulte con su mdico antes de Corporate investment banker o agregar cualquier medicamento nuevo.  INSTRUCCIONES PARA EL CUIDADO EN EL HOGAR   Modifique los factores que pueda cambiar. Consulte con su mdico para solicitar orientacin relacionada con la prdida de peso, dejar de fumar y el consumo de alcohol.   Evite las comidas y bebidas que [address] [place], [place]:   [place] con cafena o alcohlicas.   Chocolate.   Sabores a Advertising account planner.   Ajo y cebolla.   Comidas muy condimentadas.   Ctricos como naranjas, limones o limas.   Alimentos que contengan tomate, como salsas, [place] y pizza.   Alimentos fritos y Lexicographer.   Evite acostarse durante 3 horas antes de irse a dormir o antes de tomar una siesta.    Haga comidas pequeas durante Glass blower/designer de 3 comidas abundantes.   Use ropas sueltas. No use nada apretado alrededor de la cintura que cause presin en el estmago.   Levante (eleve) la cabecera de la cama 6 a 8 pulgadas (15 a 20 cm) con bloques de madera. Usar almohadas extra no ayuda.   Solo tome medicamentos que se pueden comprar sin receta o recetados para el dolor, Dentist o fiebre, como le indica el mdico.   No tome aspirina, ibuprofeno ni antiinflamatorios no esteroides.  SOLICITE ATENCIN MDICA DE [employer] SI:   [employer], el cuello, la [place], los dientes o la espalda.   El dolor aumenta o cambia la intensidad o la durancin.   Tiene nuseas, vmitos o sudoracin(diaforesis).   Siente falta de aire o dolor en el pecho, o se desmaya.   Vomita y el vmito tiene [place], es de color [place], [place], negro o es similar a la borra del caf o tiene [place].   Las heces son rojas, sanguinolentas o negras.  Estos sntomas pueden ser signos de otros  problemas, como enfermedades cardacas, hemorragias gstrias o sangrado esofgico.  ASEGRESE DE QUE:   Comprende estas instrucciones.   Controlar su enfermedad.   Solicitar ayuda de inmediato si no mejora o si empeora.  Document Released: 01/11/2005 Document Revised: 03/23/2011 [facility] Patient Information 2012 [place], [place].

## 2011-12-06 ENCOUNTER — Ambulatory Visit (INDEPENDENT_AMBULATORY_CARE_PROVIDER_SITE_OTHER): Payer: Medicaid Other | Admitting: Physician Assistant

## 2011-12-06 ENCOUNTER — Encounter: Payer: Self-pay | Admitting: *Deleted

## 2011-12-06 VITALS — BP 110/73 | Temp 98.7°F | Wt 141.9 lb

## 2011-12-06 DIAGNOSIS — O09299 Supervision of pregnancy with other poor reproductive or obstetric history, unspecified trimester: Secondary | ICD-10-CM

## 2011-12-06 DIAGNOSIS — Z3490 Encounter for supervision of normal pregnancy, unspecified, unspecified trimester: Secondary | ICD-10-CM

## 2011-12-06 LAB — POCT URINALYSIS DIP (DEVICE)
Bilirubin Urine: NEGATIVE
Glucose, UA: NEGATIVE mg/dL
Ketones, ur: NEGATIVE mg/dL
Nitrite: NEGATIVE
Protein, ur: NEGATIVE mg/dL
Specific Gravity, Urine: 1.015 (ref 1.005–1.030)
Urobilinogen, UA: 0.2 mg/dL (ref 0.0–1.0)
pH: 6.5 (ref 5.0–8.0)

## 2011-12-06 MED ORDER — PRENATAL PLUS 27-1 MG PO TABS: 1.0000 | ORAL_TABLET | Freq: Every day | ORAL | Status: DC

## 2011-12-06 NOTE — Progress Notes (Signed)
Pulse: 87 Pt needs a prescription for PNV.

## 2011-12-06 NOTE — Progress Notes (Signed)
No complaints. States "feel good". +FM daily. 1 hour at next visit.

## 2011-12-06 NOTE — Patient Instructions (Signed)
Embarazo - Segundo trimestre (Pregnancy - Second Trimester) El segundo trimestre del embarazo (del 3 al 6mes) es un perodo de evolucin rpida para usted y el beb. Hacia el final del sexto mes, el beb mide aproximadamente 23 cm y pesa 680 g. Comenzar a sentir los movimientos del beb entre las 18 y las 20 semanas de embarazo. Podr sentir las pataditas ("quickening en ingls"). Hay un rpido aumento de peso. Puede segregar un lquido claro (calostro) de las mamas. Quizs sienta pequeas contracciones en el vientre (tero) Esto se conoce como falso trabajo de parto o contracciones de Braxton-Hicks. Es como una prctica del trabajo de parto que se produce cuando el beb est listo para salir. Generalmente los problemas de vmitos matinales ya se han superado hacia el final del primer trimestre. Algunas mujeres desarrollan pequeas manchas oscuras (que se denominan cloasma, mscara del embarazo) en la cara que normalmente se van luego del nacimiento del beb. La exposicin al sol empeora las manchas. Puede desarrollarse acn en algunas mujeres embarazadas, y puede desaparecer en aquellas que ya tienen acn. EXAMENES PRENATALES  Durante los exmenes prenatales, deber seguir realizando pruebas de sangre, segn avance el embarazo. Estas pruebas se realizan para controlar su salud y la del beb. Tambin se realizan anlisis de sangre para conocer los niveles de hemoglobina. La anemia (bajo nivel de hemoglobina) es frecuente durante el embarazo. Para prevenirla, se administran hierro y vitaminas. Tambin se le realizarn exmenes para saber si tiene diabetes entre las 24 y las 28 semanas del embarazo. Podrn repetirle algunas de las pruebas que le hicieron previamente.   En cada visita le medirn el tamao del tero. Esto se realiza para asegurarse de que el beb est creciendo correctamente de acuerdo al estado del embarazo.   Tambin en cada visita prenatal controlarn su presin arterial. Esto se realiza  para asegurarse de que no tenga toxemia.   Se controlar su orina para asegurarse de que no tenga infecciones, diabetes o protena en la orina.   Se controlar su peso regularmente para asegurarse que el aumento ocurre al ritmo indicado. Esto se hace para asegurarse que usted y el beb tienen una evolucin normal.   En algunas ocasiones se realiza una prueba de ultrasonido para confirmar el correcto desarrollo y evolucin del beb. Esta prueba se realiza con ondas sonoras inofensivas para el beb, de modo que el profesional pueda calcular ms precisamente la fecha del parto.  Algunas veces se realizan pruebas especializadas del lquido amnitico que rodea al beb. Esta prueba se denomina amniocentesis. El lquido amnitico se obtiene introduciendo una aguja en el vientre (abdomen). Se realiza para controlar los cromosomas en aquellos casos en los que existe alguna preocupacin acerca de algn problema gentico que pueda sufrir el beb. En ocasiones se lleva a cabo cerca del final del embarazo, si es necesario inducir al parto. En este caso se realiza para asegurarse que los pulmones del beb estn lo suficientemente maduros como para que pueda vivir fuera del tero. CAMBIOS QUE OCURREN EN EL SEGUNDO TRIMESTRE DEL EMBARAZO Su organismo atravesar numerosos cambios durante el embarazo. Estos pueden variar de una persona a otra. Converse con el profesional que la asiste acerca los cambios que usted note y que la preocupen.  Durante el segundo trimestre probablemente sienta un aumento del apetito. Es normal tener "antojos" de ciertas comidas. Esto vara de una persona a otra y de un embarazo a otro.   El abdomen inferior comenzar a abultarse.   Podr tener la necesidad   de orinar con ms frecuencia debido a que el tero y el beb presionan sobre la vejiga. Tambin es frecuente contraer ms infecciones urinarias durante el embarazo (dolor al orinar). Puede evitarlas bebiendo gran cantidad de lquidos y  vaciando la vejiga antes y despus de mantener relaciones sexuales.   Podrn aparecer las primeras estras en las caderas, abdomen y mamas. Estos son cambios normales del cuerpo durante el embarazo. No existen medicamentos ni ejercicios que puedan prevenir estos cambios.   Es posible que comience a desarrollar venas inflamadas y abultadas (varices) en las piernas. El uso de medias de descanso, elevar sus pies durante 15 minutos, 3 a 4 veces al da y limitar la sal en su dieta ayuda a aliviar el problema.   Podr sentir acidez gstrica a medida que el tero crece y presiona contra el estmago. Puede tomar anticidos, con la autorizacin de su mdico, para aliviar este problema. Tambin es til ingerir pequeas comidas 4 a 5 veces al da.   La constipacin puede tratarse con un laxante o agregando fibra a su dieta. Beber grandes cantidades de lquidos, comer vegetales, frutas y granos integrales es de gran ayuda.   Tambin es beneficioso practicar actividad fsica. Si ha sido una persona activa hasta el embarazo, podr continuar con la mayora de las actividades durante el mismo. Si ha sido menos activa, puede ser beneficioso que comience con un programa de ejercicios, como realizar caminatas.   Puede desarrollar hemorroides (vrices en el recto) hacia el final del segundo trimestre. Tomar baos de asiento tibios y utilizar cremas recomendadas por el profesional que lo asiste sern de ayuda para los problemas de hemorroides.   Tambin podr sentir dolor de espalda durante este momento de su embarazo. Evite levantar objetos pesados, utilice zapatos de taco bajo y mantenga una buena postura para ayudar a reducir los problemas de espalda.   Algunas mujeres embarazadas desarrollan hormigueo y adormecimiento de la mano y los dedos debido a la hinchazn y compresin de los ligamentos de la mueca (sndrome del tnel carpiano). Esto desaparece una vez que el beb nace.   Como sus pechos se agrandan,  necesitar un sujetador ms grande. Use un sostn de soporte, cmodo y de algodn. No utilice un sostn para amamantar hasta el ltimo mes de embarazo si va a amamantar al beb.   Podr observar una lnea oscura desde el ombligo hacia la zona pbica denominada linea nigra.   Podr observar que sus mejillas se ponen coloradas debido al aumento de flujo sanguneo en la cara.   Podr desarrollar "araitas" en la cara, cuello y pecho. Esto desaparece una vez que el beb nace.  INSTRUCCIONES PARA EL CUIDADO DOMICILIARIO  Es extremadamente importante que evite el cigarrillo, hierbas medicinales, alcohol y las drogas no prescriptas durante el embarazo. Estas sustancias qumicas afectan la formacin y el desarrollo del beb. Evite estas sustancias durante todo el embarazo para asegurar el nacimiento de un beb sano.   La mayor parte de los cuidados que se aconsejan son los mismos que los indicados para el primer trimestre del embarazo. Cumpla con las citas tal como se le indic. Siga las instrucciones del profesional que lo asiste con respecto al uso de los medicamentos, el ejercicio y la dieta.   Durante el embarazo debe obtener nutrientes para usted y para su beb. Consuma alimentos balanceados a intervalos regulares. Elija alimentos como carne, pescado, leche y otros productos lcteos descremados, vegetales, frutas, panes integrales y cereales. El profesional le informar cul es el   aumento de peso ideal.   Las relaciones sexuales fsicas pueden continuarse hasta cerca del fin del embarazo si no existen otros problemas. Estos problemas pueden ser la prdida temprana (prematura) de lquido amnitico de las membranas, sangrado vaginal, dolor abdominal u otros problemas mdicos o del embarazo.   Realice actividad fsica todos los das, si no tiene restricciones. Consulte con el profesional que la asiste si no sabe con certeza si determinados ejercicios son seguros. El mayor aumento de peso tiene lugar  durante los ltimos 2 trimestres del embarazo. El ejercicio la ayudar a:   Controlar su peso.   Ponerla en forma para el parto.   Ayudarla a perder peso luego de haber dado a luz.   Use un buen sostn o como los que se usan para hacer deportes para aliviar la sensibilidad de las mamas. Tambin puede serle til si lo usa mientras duerme. Si pierde calostro, podr utilizar apsitos en el sostn.   No utilice la baera con agua caliente, baos turcos y saunas durante el embarazo.   Utilice el cinturn de seguridad sin excepcin cuando conduzca. Este la proteger a usted y al beb en caso de accidente.   Evite comer carne cruda, queso crudo, y el contacto con los utensilios y desperdicios de los gatos. Estos elementos contienen grmenes que pueden causar defectos de nacimiento en el beb.   El segundo trimestre es un buen momento para visitar a su dentista y evaluar su salud dental si an no lo ha hecho. Es importante mantener los dientes limpios. Utilice un cepillo de dientes blando. Cepllese ms suavemente durante el embarazo.   Es ms fcil perder algo de orina durante el embarazo. Apretar y fortalecer los msculos de la pelvis la ayudar con este problema. Practique detener la miccin cuando est en el bao. Estos son los mismos msculos que necesita fortalecer. Son tambin los mismos msculos que utiliza cuando trata de evitar los gases. Puede practicar apretando estos msculos 10 veces, y repetir esto 3 veces por da aproximadamente. Una vez que conozca qu msculos debe apretar, no realice estos ejercicios durante la miccin. Puede favorecerle una infeccin si la orina vuelve hacia atrs.   Pida ayuda si tiene necesidades econmicas, de asesoramiento o nutricionales durante el embarazo. El profesional podr ayudarla con respecto a estas necesidades, o derivarla a otros especialistas.   La piel puede ponerse grasa. Si esto sucede, lvese la cara con un jabn suave, utilice un humectante no  graso y maquillaje con base de aceite o crema.  CONSUMO DE MEDICAMENTOS Y DROGAS DURANTE EL EMBARAZO  Contine tomando las vitaminas apropiadas para esta etapa tal como se le indic. Las vitaminas deben contener un miligramo de cido flico y deben suplementarse con hierro. Guarde todas las vitaminas fuera del alcance de los nios. La ingestin de slo un par de vitaminas o tabletas que contengan hierro puede ocasionar la muerte en un beb o en un nio pequeo.   Evite el uso de medicamentos, inclusive los de venta libre y hierbas que no hayan sido prescriptos o indicados por el profesional que la asiste. Algunos medicamentos pueden causar problemas fsicos al beb. Utilice los medicamentos de venta libre o de prescripcin para el dolor, el malestar o la fiebre, segn se lo indique el profesional que lo asiste. No utilice aspirina.   El consumo de alcohol est relacionado con ciertos defectos de nacimiento. Esto incluye el sndrome de alcoholismo fetal. Debe evitar el consumo de alcohol en cualquiera de sus formas. El cigarrillo   causa nacimientos prematuros y bebs de bajo peso. El uso de drogas recreativas est absolutamente prohibido. Son muy nocivas para el beb. Un beb que nace de una madre adicta, ser adicto al nacer. Ese beb tendr los mismos sntomas de abstinencia que un adulto.   Infrmele al profesional si consume alguna droga.   No consuma drogas ilegales. Pueden causarle mucho dao al beb.  SOLICITE ATENCIN MDICA SI: Tiene preguntas o preocupaciones durante su embarazo. Es mejor que llame para consultar las dudas que esperar hasta su prxima visita prenatal. De esta forma se sentir ms tranquila.  SOLICITE ATENCIN MDICA DE INMEDIATO SI:  La temperatura oral se eleva sin motivo por encima de 102 F (38.9 C) o segn le indique el profesional que lo asiste.   Tiene una prdida de lquido por la vagina (canal de parto). Si sospecha una ruptura de las membranas, tmese la  temperatura y llame al profesional para informarlo sobre esto.   Observa unas pequeas manchas, una hemorragia vaginal o elimina cogulos. Notifique al profesional acerca de la cantidad y de cuntos apsitos est utilizando. Unas pequeas manchas de sangre son algo comn durante el embarazo, especialmente despus de mantener relaciones sexuales.   Presenta un olor desagradable en la secrecin vaginal y observa un cambio en el color, de transparente a blanco.   Contina con las nuseas y no obtiene alivio de los remedios indicados. Vomita sangre o algo similar a la borra del caf.   [place] o sube ms de 900 g. en una semana, o segn lo indicado por el profesional que la asiste.   Observa que se le hinchan el rostro, las manos, los pies o las piernas.   Ha estado expuesta a la rubola y no ha sufrido la enfermedad.   Ha estado expuesta a la quinta enfermedad o a la varicela.   Presenta dolor abdominal. Las molestias en el ligamento redondo son una causa no cancerosa (benigna) frecuente de dolor abdominal durante el embarazo. El profesional que la asiste deber evaluarla.   Presenta dolor de cabeza intenso que no se alivia.   Presenta fiebre, diarrea, dolor al orinar o le falta la respiracin.   Presenta dificultad para ver, visin borrosa, o visin doble.   Sufre una cada, un accidente de trnsito o cualquier tipo de trauma.   Vive en un hogar en el que existe violencia fsica o mental.  Document Released: 01/11/2005 Document Revised: 03/23/2011 [employer] Patient Information 2012 [employer]. 

## 2011-12-20 ENCOUNTER — Encounter: Payer: Self-pay | Admitting: *Deleted

## 2011-12-20 ENCOUNTER — Ambulatory Visit (INDEPENDENT_AMBULATORY_CARE_PROVIDER_SITE_OTHER): Payer: Medicaid Other | Admitting: Advanced Practice Midwife

## 2011-12-20 DIAGNOSIS — O09299 Supervision of pregnancy with other poor reproductive or obstetric history, unspecified trimester: Secondary | ICD-10-CM

## 2011-12-20 DIAGNOSIS — Z3490 Encounter for supervision of normal pregnancy, unspecified, unspecified trimester: Secondary | ICD-10-CM

## 2011-12-20 LAB — POCT URINALYSIS DIP (DEVICE)
Bilirubin Urine: NEGATIVE
Glucose, UA: NEGATIVE mg/dL
Hgb urine dipstick: NEGATIVE
Ketones, ur: NEGATIVE mg/dL
Leukocytes, UA: NEGATIVE
Nitrite: NEGATIVE
Protein, ur: NEGATIVE mg/dL
Specific Gravity, Urine: 1.015 (ref 1.005–1.030)
Urobilinogen, UA: 0.2 mg/dL (ref 0.0–1.0)
pH: 6.5 (ref 5.0–8.0)

## 2011-12-20 NOTE — Progress Notes (Signed)
Pt doing well, reports good fetal movement, denies cramping/contractions, vaginal bleeding, denies h/a, epigastric pain, visual disturbances.  Reports heartburn from earlier in pregnancy improved with Zantac.  1 hour GTT and labs drawn today.

## 2011-12-21 ENCOUNTER — Encounter: Payer: Self-pay | Admitting: Physician Assistant

## 2011-12-21 LAB — GLUCOSE TOLERANCE, 1 HOUR (50G) W/O FASTING: Glucose, 1 Hour GTT: 134 mg/dL (ref 70–140)

## 2012-01-03 ENCOUNTER — Telehealth: Payer: Self-pay | Admitting: *Deleted

## 2012-01-03 ENCOUNTER — Ambulatory Visit (INDEPENDENT_AMBULATORY_CARE_PROVIDER_SITE_OTHER): Payer: Medicaid Other | Admitting: Advanced Practice Midwife

## 2012-01-03 ENCOUNTER — Encounter: Payer: Self-pay | Admitting: *Deleted

## 2012-01-03 VITALS — BP 127/86 | Temp 99.3°F | Wt 148.5 lb

## 2012-01-03 DIAGNOSIS — O09299 Supervision of pregnancy with other poor reproductive or obstetric history, unspecified trimester: Secondary | ICD-10-CM

## 2012-01-03 DIAGNOSIS — O209 Hemorrhage in early pregnancy, unspecified: Secondary | ICD-10-CM

## 2012-01-03 DIAGNOSIS — Z3490 Encounter for supervision of normal pregnancy, unspecified, unspecified trimester: Secondary | ICD-10-CM

## 2012-01-03 DIAGNOSIS — Z23 Encounter for immunization: Secondary | ICD-10-CM

## 2012-01-03 DIAGNOSIS — O359XX0 Maternal care for (suspected) fetal abnormality and damage, unspecified, not applicable or unspecified: Secondary | ICD-10-CM

## 2012-01-03 LAB — POCT URINALYSIS DIP (DEVICE)
Bilirubin Urine: NEGATIVE
Glucose, UA: NEGATIVE mg/dL
Ketones, ur: NEGATIVE mg/dL
Nitrite: NEGATIVE
Protein, ur: NEGATIVE mg/dL
Specific Gravity, Urine: 1.02 (ref 1.005–1.030)
Urobilinogen, UA: 0.2 mg/dL (ref 0.0–1.0)
pH: 6.5 (ref 5.0–8.0)

## 2012-01-03 LAB — CBC
HCT: 35.7 % — ABNORMAL LOW (ref 36.0–46.0)
Hemoglobin: 12.7 g/dL (ref 12.0–15.0)
MCH: 31.2 pg (ref 26.0–34.0)
MCHC: 35.6 g/dL (ref 30.0–36.0)
MCV: 87.7 fL (ref 78.0–100.0)
Platelets: 220 10*3/uL (ref 150–400)
RBC: 4.07 MIL/uL (ref 3.87–5.11)
RDW: 13.4 % (ref 11.5–15.5)
WBC: 8.4 10*3/uL (ref 4.0–10.5)

## 2012-01-03 LAB — HIV ANTIBODY (ROUTINE TESTING W REFLEX): HIV: NONREACTIVE

## 2012-01-03 MED ORDER — TETANUS-DIPHTH-ACELL PERTUSSIS 5-2.5-18.5 LF-MCG/0.5 IM SUSP: 0.5000 mL | Freq: Once | INTRAMUSCULAR | Status: DC

## 2012-01-03 NOTE — Telephone Encounter (Signed)
Message copied by Jill Side on Wed Jan 03, 2012 11:06 AM ------      Message from: [place], [place] L      Created: Wed Jan 03, 2012 10:57 AM      Regarding: need fu [place]       Forgot to get scheduled            Followup US to check bilat pylectasis

## 2012-01-03 NOTE — Addendum Note (Signed)
Addended by: Faythe Casa on: 01/03/2012 12:05 PM   Modules accepted: Orders

## 2012-01-03 NOTE — Telephone Encounter (Signed)
Pt notified with assistance from [facility] of her appt for [place] on 9/20 @ 1615.  Pt agreed and voiced understanding.

## 2012-01-03 NOTE — Progress Notes (Signed)
P=79. C/o on 01/01/12 had pain in her pelvic area and lower back that lasted 1/2 of the day- states it felt "wierd and stomach felt hard". Thinks maybe having occasional contractions. Used interpreter Elmer Sow. Discussed tday, considering getting it today.

## 2012-01-03 NOTE — Progress Notes (Signed)
Some contractions, most not painful, only on one day. Cervix long and closed.  Otherwise feels fine. Will complete labs (HIV, RPR, CBC) and TDAP vaccine today.  Glucola normal.  Will schedule followup US for bilateral pylectasis

## 2012-01-04 LAB — RPR

## 2012-01-05 ENCOUNTER — Encounter: Payer: Self-pay | Admitting: Advanced Practice Midwife

## 2012-01-05 ENCOUNTER — Ambulatory Visit (HOSPITAL_COMMUNITY)
Admission: RE | Admit: 2012-01-05 | Discharge: 2012-01-05 | Disposition: A | Discharge status: Home / Self Care | Payer: Medicaid Other | Source: Ambulatory Visit | Attending: Advanced Practice Midwife | Admitting: Advanced Practice Midwife

## 2012-01-05 DIAGNOSIS — O358XX0 Maternal care for other (suspected) fetal abnormality and damage, not applicable or unspecified: Secondary | ICD-10-CM | POA: Insufficient documentation

## 2012-01-05 DIAGNOSIS — Z8751 Personal history of pre-term labor: Secondary | ICD-10-CM | POA: Insufficient documentation

## 2012-01-05 DIAGNOSIS — IMO0002 Reserved for concepts with insufficient information to code with codable children: Secondary | ICD-10-CM | POA: Insufficient documentation

## 2012-01-05 DIAGNOSIS — O359XX0 Maternal care for (suspected) fetal abnormality and damage, unspecified, not applicable or unspecified: Secondary | ICD-10-CM

## 2012-01-05 DIAGNOSIS — O09299 Supervision of pregnancy with other poor reproductive or obstetric history, unspecified trimester: Secondary | ICD-10-CM | POA: Insufficient documentation

## 2012-01-17 ENCOUNTER — Ambulatory Visit (INDEPENDENT_AMBULATORY_CARE_PROVIDER_SITE_OTHER): Payer: Medicaid Other | Admitting: Family Medicine

## 2012-01-17 ENCOUNTER — Encounter: Payer: Self-pay | Admitting: Advanced Practice Midwife

## 2012-01-17 VITALS — BP 120/80 | Temp 98.1°F | Wt 148.3 lb

## 2012-01-17 DIAGNOSIS — Z23 Encounter for immunization: Secondary | ICD-10-CM

## 2012-01-17 DIAGNOSIS — Z3490 Encounter for supervision of normal pregnancy, unspecified, unspecified trimester: Secondary | ICD-10-CM

## 2012-01-17 DIAGNOSIS — O9989 Other specified diseases and conditions complicating pregnancy, childbirth and the puerperium: Secondary | ICD-10-CM

## 2012-01-17 DIAGNOSIS — O358XX0 Maternal care for other (suspected) fetal abnormality and damage, not applicable or unspecified: Secondary | ICD-10-CM

## 2012-01-17 DIAGNOSIS — K219 Gastro-esophageal reflux disease without esophagitis: Secondary | ICD-10-CM

## 2012-01-17 MED ORDER — INFLUENZA VIRUS VACC SPLIT PF IM SUSP: 0.5000 mL | Freq: Once | INTRAMUSCULAR | Status: DC

## 2012-01-17 MED ORDER — LANSOPRAZOLE 15 MG PO CPDR: 15.0000 mg | DELAYED_RELEASE_CAPSULE | Freq: Every day | ORAL | Status: DC

## 2012-01-17 NOTE — Patient Instructions (Signed)
Stop Zantac in 2 days and start Prevacid today. If still having problems, call. We can go up on the dose of Prevacid.  Embarazo  Systems analyst trimestre  (Pregnancy - Third Trimester) El tercer trimestre del Psychiatrist (los ltimos 3 meses) es el perodo en el cual tanto usted como su beb crecen con ms rapidez. El beb alcanza un largo de aproximadamente 50 cm. y pesa entre 2,700 y 4,500 kg. El beb gana ms tejido graso y est listo para la vida fuera del cuerpo de la [place]. Mientras estn en el interior, los bebs tienen perodos de sueo y vigilia, Warehouse manager y tienen hipo. Quizs sienta pequeas contracciones del tero. Este es el falso trabajo de [place]. Tambin se las conoce como contracciones de [place] . Es como una prctica del parto. Los problemas ms habituales de esta etapa del embarazo incluyen mayor dificultad para respirar, hinchazn de las manos y los pies por retencin de lquidos y la necesidad de Geographical information systems officer con ms frecuencia debido a que el tero y el beb presionan sobre la vejiga.  EXAMENES PRENATALES   Durante los [employer], deber seguir realizndose anlisis de [place]. Estas pruebas se realizan para controlar su salud y la del beb. Los [employer] de sangre se Radiographer, therapeutic para [employer] niveles de algunos compuestos de la sangre (hemoglobina). La anemia (bajo nivel de hemoglobina) es frecuente durante el embarazo. Para prevenirla, se administran hierro y vitaminas. Tambin le tomarn nuevas anlisis para descartar diabetes. Podrn repetirle algunas de las [employer] hicieron previamente.  En cada visita le medirn el tamao del tero. Esto permite asegurar que el beb se desarrolla adecuadamente, segn la fecha del embarazo.  Le controlarn la presin arterial en cada visita prenatal. Esto es para asegurarse de que no sufre toxemia.  Le harn un anlisis de orina en cada visita prenatal, para descartar infecciones, diabetes y la presencia de protenas.  Tambin  en cada visita controlarn su peso. Esto se realiza para asegurarse que aumenta de peso al ritmo indicado y que usted y su beb evolucionan normalmente.  En algunas ocasiones se realiza una prueba de ultrasonido para confirmar el correcto desarrollo y evolucin del beb. Esta prueba se realiza con ondas sonoras inofensivas para el beb, de modo que el profesional pueda calcular ms precisamente la fecha del [place].  Analice con su mdico los analgsicos y la anestesia que recibir durante el [place] de parto y [place].  Comente la posibilidad de que necesite una cesrea y qu anestesia se recibir.  Informe a su mdico si sufre violencia familiar mental o fsica. A veces, se indica la prueba especializada sin estrs, la prueba de tolerancia a las contracciones y el perfil biofsico para asegurarse de que el beb no tiene problemas. El estudio del lquido amnitico que rodea al beb se llama amniocentesis. El lquido amnitico se obtiene introduciendo una aguja en el vientre (abdomen ). En ocasiones se lleva a cabo cerca del final del embarazo, si es necesario inducir a un parto. En este caso se realiza para asegurarse que los pulmones del beb estn lo suficientemente maduros como para que pueda vivir fuera del tero. Si los pulmones no han madurado y es peligroso que el beb nazca, se Building services engineer a la madre una inyeccin de [place] , 1 a 2 [address]  [address] antes del [address]. Vivia Budge ayuda a que los pulmones del beb maduren y sea ms seguro su nacimiento.  CAMBIOS QUE OCURREN EN EL TERCER TRIMESTRE DEL EMBARAZO  Su organismo atravesar  numerosos cambios Academic librarian. Estos pueden variar de Neomia Dear persona a otra. Converse con el profesional que la asiste acerca los cambios que usted note y que la preocupen.   Durante el ltimo trimestre probablemente sienta un aumento del apetito. Es normal tener "antojos" de Development worker, community. Esto vara de Neomia Dear persona a otra y de un embarazo a Therapist, art.  Podrn aparecer las  primeras estras en las caderas, abdomen y [place]. Estos son cambios normales del cuerpo durante el [place]. No existen medicamentos ni ejercicios que puedan prevenir [employer].  La constipacin puede tratarse con un laxante o agregando fibra a su dieta. Beber grandes cantidades de lquidos, tomar fibras en forma de vegetales, frutas y granos integrales es de gran  [place].  Tambin es beneficioso practicar actividad fsica. Si ha sido una persona Engineer, mining, podr continuar con la [employer]son de las actividades durante el mismo. Si ha sido [employer], puede ser beneficioso que comience con un programa de ejercicios, Museum/gallery exhibitions officer. Consulte con el profesional que la asiste antes de comenzar un programa de ejercicios.  Evite el consumo de cigarrillos, el alcohol, los medicamentos no recetados y las "drogas de la calle" durante el Psychiatrist. Estas sustancias qumicas afectan la formacin y el desarrollo del beb. Evite estas sustancias durante todo el embarazo para asegurar el nacimiento de un beb sano.  Podr sentir dolor de espalda, tener vrices en las venas y hemorroides, o si ya los sufra, pueden [place].  Durante el tercer trimestre se cansar con ms facilidad, lo cual es normal.  Los movimientos del beb pueden ser ms fuertes y con ms frecuencia.  Puede que note dificultades para respirar normalmente.  El ombligo puede salir hacia afuera.  A veces sale Veterinary surgeon de las [place], que se llama Product manager.  Podr aparecer Neomia Dear secrecin mucosa con sangre. Esto suele ocurrir General Electric unos [address] y Neomia Dear semana antes del [place]. INSTRUCCIONES PARA EL CUIDADO EN EL HOGAR   Cumpla con las citas de control. Siga las indicaciones del mdico con respecto al uso de [place], los ejercicios y la dieta.  Durante el embarazo debe obtener nutrientes para usted y para su beb. Consuma alimentos balanceados a intervalos regulares. Elija alimentos como carne, pescado,  [place] y otros productos lcteos descremados, vegetales, frutas, panes integrales y cereales. El [employer]ot informar cul es el aumento de peso ideal.  Las relaciones sexuales pueden continuarse hasta casi el final del embarazo, si no se presentan otros problemas como prdida prematura (antes de [place]) de lquido amnitico, hemorragia vaginal o dolor en el vientre (abdominal).  Realice Tes[employer], si no tiene restricciones. Consulte con el profesional que la asiste si no sabe con certeza si determinados ejercicios son seguros. El mayor aumento de peso se producir en los ltimos 2 trimestres del Psychiatrist. El ejercicio ayuda a:  Engineering geologist.  Mantenerse en forma para el trabajo de parto y [place] .  Perder peso despus del parto.  Haga reposo con frecuencia, con las piernas elevadas, o segn lo necesite para evitar los calambres y el dolor de cintura.  Use un buen sostn o como los que se usan para hacer deportes para Paramedic la sensibilidad de las [place]. Tambin puede serle til si lo [place] mientras duerme. Si pierde Product manager, podr [employer]in.  No utilice la baera con agua caliente, baos turcos y saunas.  Colquese el cinturn de seguridad cuando conduzca. Este la proteger a usted y  al beb en caso de accidente.  Evite comer carne cruda y el contacto con los utensilios y desperdicios de los gatos. Estos elementos contienen grmenes que pueden causar defectos de nacimiento en el beb.  Es fcil perder algo de orina durante el [place]. Apretar y Chief Operating Officer los msculos de la pelvis la ayudar con este problema. Practique detener la miccin cuando est en el bao. Estos son los mismos msculos que Development worker, international aid. Son [employer] mismos msculos que utiliza cuando trata de evitar despedir gases. Puede practicar apretando estos msculos [employer], y repetir esto tres veces por da aproximadamente. Una vez que conozca qu msculos debe apretar,  no realice estos ejercicios durante la miccin. Puede favorecerle una infeccin si la orina vuelve hacia atrs.  Pida ayuda si tienen necesidades financieras, teraputicas o nutricionales. El profesional podr ayudarla con respecto a estas necesidades, o derivarla a otros especialistas.  Haga una lista de nmeros telefnicos de emergencia y tngalos disponibles.  Planifique como obtener ayuda de familiares o amigos cuando regrese a Programmer, applications hospital.  Hacer un ensayo sobre la partida al hospital.  [place] clases prenatales con el padre para entender, practicar y hacer preguntas sobre el [place] de parto y el alumbramiento.  Preparar la habitacin del beb / busque Fatima Blank.  No viaje fuera de la ciudad a menos que sea absolutamente necesario y con el asesoramiento de su mdico.  Use slo zapatos de tacn bajo o sin tacn para tener mejor equilibrio y Automotive engineer cadas. USO DE MEDICAMENTOS Y CONSUMO DE DROGAS DURANTE EL [facility]   Tome las vitaminas apropiadas para esta etapa tal como se le indic. Las vitaminas deben contener un miligramo de cido flico. Guarde todas las vitaminas fuera del alcance de los nios. La ingestin de slo un par de vitaminas o tabletas que contengan hierro pueden ocasionar la [employer] Mining en un beb o en un nio pequeo.  Evite el uso de [employer] Mutual [employer] Omaha, incluyendo hierbas, medicamentos de [place], sin receta o que no hayan sido sugeridos por su mdico. Slo tome medicamentos de venta libre o medicamentos recetados para Chief Technology Officer, Environmental health practitioner o fiebre como lo indique su mdico. No tome aspirina, ibuprofeno (Motrin, Advil, Nuprin) o naproxeno (Aleve) excepto que su mdico se lo indique.  Infrmele al profesional si consume alguna droga.  El alcohol se relaciona con ciertos defectos congnitos. Incluye el sndrome de alcoholismo fetal. Debe evitar absolutamente el consumo de alcohol, en cualquier forma. El fumar produce baja tasa de natalidad y bebs  prematuros.  Las drogas ilegales o de la calle son muy perjudiciales para el beb. Estn absolutamente prohibidas. Un beb que nace de [employer], ser adicto al nacer. Ese beb tendr los mismos sntomas de abstinencia que un adulto. SOLICITE ATENCIN MDICA SI:  Tiene preguntas o preocupaciones relacionadas con el embarazo. Es mejor que llame para formular las preguntas si no puede esperar hasta la prxima visita, que sentirse preocupada por ellas.  DECISIONES ACERCA DE LA CIRCUNCISIN  Usted puede saber o no cul es el sexo de su beb. Si ya sabe que ser un varn, este es el momento de pensar acerca de la circuncisin. La circuncisin es la extirpacin del prepucio. Esta es la piel que cubre el extremo sensible del pene. No hay un motivo mdico que lo justifique. Generalmente la decisin se toma segn lo que sea popular en ese momento, o segn creencias religiosas. Podr conversar estos temas con su mdico o con el pediatra.  SOLICITE ATENCIN  MDICA DE INMEDIATO SI:   La temperatura oral le sube a ms de 102 F (38.9 C) o lo que su mdico le indique.  Tiene una prdida de lquido por la vagina (canal de parto). Si sospecha una ruptura de las [place], tmese la temperatura y llame al profesional para informarlo sobre esto.  Observa unas pequeas manchas, una hemorragia vaginal o elimina cogulos. Notifique al profesional acerca de la cantidad y de cuntos apsitos est utilizando.  Presenta un olor desagradable en la secrecin vaginal y observa un cambio en el color, de transparente a blanco.  Ha vomitado durante ms de 24 horas.  Siente escalofros o le sube la fiebre.  Le falta el aire.  Siente ardor al Beatrix Shipper.  Baja o sube ms de 2 libras (900 g), o segn lo indicado por el profesional que la asiste.  Observa que sbitamente se le hinchan el rostro, las manos, los pies o las piernas.  Siente dolor en el vientre (abdominal). Las [employer] en el ligamento redondo son Neomia Dear  causa benigna frecuente de dolor abdominal durante el embarazo. El profesional que la asiste deber evaluarla.  Presenta dolor de cabeza intenso que no se [place].  Tiene problemas visuales, visin doble o borrosa.  Si no siente los movimientos del beb durante ms de 1 hora. Si piensa que el beb no se mueve tanto como lo haca habitualmente, coma algo que Psychologist, clinical y [employer] Corporation lado izquierdo durante [place]. El beb debe moverse al menos 4  5 veces por hora. Comunquese inmediatamente si el beb se mueve menos que lo indicado.  Se cae, se ve involucrada en un accidente automovilstico o sufre algn tipo de traumatismo.  En su hogar hay violencia mental o fsica. Document Released: 01/11/2005 Document Revised: 10/03/2011 [facility] Patient Information 2013 [place], [place].   Acidez de estmago durante el embarazo  (Heartburn During Pregnancy)  La acidez es la sensacin de ardor en el pecho que se siente cuando el cido del estmago vuelve haca el esfago. La acidez (tambin llamada "reflujo") es frecuente en el embarazo debido a ciertos cambios hormonales (progesterona). La progesterona relaja la vlvula que separa el esfago del [place]. Esto hace que el cido suba al esfago y cause acidez. Tambin puede ocurrir Visual merchandiser debido a que el tero al agrandarse empuja el estmago, lo que hace que suba ms cido al esfago. Esto se produce especialmente en las ltimas etapas del embarazo. La acidez generalmente desaparece despus del parto.  CAUSAS  La hormona progesterona.   Cambios en los niveles hormonales.   El tero crece y [address] cido del estmago [place].   Comidas abundantes.   Ciertos alimentos y bebidas.   La prctica de ejercicios.   Aumento en la produccin de cido.  SNTOMAS  Dolor intenso en el pecho o la parte baja de la garganta.   Gusto amargo en la boca.   Tos.  DIAGNSTICO El mdico la diagnostica con una historia clnica  cuidadosa en la que pregunta por sus molestias. Le indicar un anlisis de sangre para Clinical research associate cierto tipo de bacteria que se asocia con la [place]. En algunos casos se diagnostica recetando un medicamento para calmar la acidez y viendo si los sntomas mejoran. Durante el embarazo no es frecuente que se indique una endoscopa. En este procedimiento se [place] un tubo con Neomia Dear luz y una cmara en un extremo, y se examina el esfago y Investment banker, corporate.  TRATAMIENTO  El mdico aconsejar [place]  de medicamentos de [employer] (anticidos, medicamentos para disminuir la Engineering geologist) en los casos de sntomas leves.   El mdico indicar medicamentos para disminuir el cido estomacal o para proteger la superficie del [place].   El profesional indicar cambios en la dieta.   En casos graves, el mdico recomendar que eleve la cabecera de la cama con bloques. (Dormir con ms almohadas no es Manufacturing systems engineer, ya que slo modifica la posicin de la cabeza y no mejora el problema principal del reflujo cido del estmago al esfago.)  INSTRUCCIONES PARA EL CUIDADO DOMICILIARIO  Tome todos los medicamentos segn le indic su mdico.   Levante la cabecera de la cama con bloques bajo las patas.   No haga ejercicios enseguida despus de comer.   Evite comer 2  3 horas antes de ir a dormir.  No se acueste enseguida despus de comer.   Haga comidas pequeas durante Glass blower/designer de 3 comidas abundantes.   Identifique los alimentos o las bebidas que empeoran sus sntomas y evtelos. Los alimentos que debe evitar son:   Pimientos.   Chocolate.   Alimentos alto en grasas, incluidos los fritos.   Comidas muy condimentadas.   Ajo, cebolla.   Frutos ctricos, que incluyen naranjas, uvas, limones y limas.   Alimentos que contengan tomates o productos derivados del [place].   Menta.   Bebidas gaseosas y con cafena.   Vinagre.  SOLICITE ATENCIN MDICA DE INMEDIATO SI:  Tiene dolor intenso en el  pecho que baja hacia el brazo o hacia la mandbula o cuello.   Se siente sudoroso, mareado o sufre un desmayo.   Falta de aire.   Vomita sangre.   Dolor o dificultad para tragar.   Materia fecal con sangre o de color negro.   Tiene acidez ms de 3 veces por semana durante ms de 2 semanas.  ASEGRESE DE QUE:   Comprende estas instrucciones.   Controlar su enfermedad.   Solicitar ayuda de inmediato si no mejora o si empeora.  Document Released: 01/11/2005 Document Revised: 03/23/2011 [facility] Patient Information 2012 [place], [place].

## 2012-01-17 NOTE — Progress Notes (Signed)
Pulse- 75  Edema-feet  Pressure-lower abd

## 2012-01-17 NOTE — Progress Notes (Signed)
Feels lower uterine tightening but no pressure. Denies HA, vision changes. Does have severe reflux not controlled by Zantac. Will switch to Prevacid. Follow up sono for pylectasis shows normal kidneys 9/20. Flu shot today. Measuring small for dates but sono 9/20 shows normal growth. Monitor. No weight gain since last visit, not nauseated but may be due to reflux. Discussed anti-reflux measures, eating smaller more frequent meals.

## 2012-01-31 ENCOUNTER — Encounter: Payer: Self-pay | Admitting: *Deleted

## 2012-01-31 ENCOUNTER — Ambulatory Visit (INDEPENDENT_AMBULATORY_CARE_PROVIDER_SITE_OTHER): Payer: Medicaid Other | Admitting: Family Medicine

## 2012-01-31 VITALS — BP 124/79 | Temp 98.1°F | Wt 152.1 lb

## 2012-01-31 DIAGNOSIS — O09299 Supervision of pregnancy with other poor reproductive or obstetric history, unspecified trimester: Secondary | ICD-10-CM

## 2012-01-31 LAB — POCT URINALYSIS DIP (DEVICE)
Bilirubin Urine: NEGATIVE
Glucose, UA: NEGATIVE mg/dL
Ketones, ur: NEGATIVE mg/dL
Nitrite: NEGATIVE
Protein, ur: NEGATIVE mg/dL
Specific Gravity, Urine: 1.02 (ref 1.005–1.030)
Urobilinogen, UA: 0.2 mg/dL (ref 0.0–1.0)
pH: 6.5 (ref 5.0–8.0)

## 2012-01-31 MED ORDER — LANSOPRAZOLE 15 MG PO CPDR: 30.0000 mg | DELAYED_RELEASE_CAPSULE | Freq: Every day | ORAL | Status: DC

## 2012-01-31 NOTE — Progress Notes (Signed)
P = 84 Mild edema in feet

## 2012-01-31 NOTE — Progress Notes (Signed)
Bad reflux even with prevacid and zantac.  Increase prevacid to 30 mg daily. Denies contractions, bleeding, loss of fluid.

## 2012-01-31 NOTE — Patient Instructions (Signed)
Vaginal Bleeding During Pregnancy, Third Trimester A small amount of bleeding (spotting) is relatively common in pregnancy. Sometimes bleeding may be "normal." Bleeding in the third trimester can be very serious for the mother and the baby, and should be reported to your caregiver right away. It is very important to follow your caregiver's instructions. CAUSES Possible causes of bleeding during the third trimester:  The placenta may be partially covering or completely covering the opening to the cervix (placenta previa).  The placenta may have separated from the uterus (abruption of the placenta).  There may be an infection or growth on the cervix.  You may be starting labor, called discharging of the mucus plug.  The placenta may grow into the muscle layer of the uterus (placenta accreta). DIAGNOSIS  Your caregiver may do:  Pelvic exam in the delivery room, called a double set up (being ready to do an emergency cesarean delivery, if necessary).  Blood tests, to see if you are anemic (not having enough red blood cells).  Ultrasound and fetal monitoring, to see if the baby is having problems.  Ultrasound, to find out why you are bleeding. TREATMENT   You may need to remain in the hospital.  You may be given an IV (intravenous) while you are in the hospital.  You may be put on strict bed rest.  You may need a blood transfusion.  You may be placed on oxygen.  The baby may need to be delivered immediately. HOME CARE INSTRUCTIONS   Your caregiver may order bed rest (getting up to go to the bathroom only). At this time, you may need to make arrangements for the care of children and for other responsibilities.  Keep track of the number of pads you use each day and how soaked (saturated) they are. Write this down.  Do not use tampons. Do not douche.  Do not have sexual intercourse or any sexual activity that may cause an orgasm, until approved by your caregiver.  Follow your  caregiver's advice about lifting, driving, physical and social activities.  Eat a balanced and nutritious diet.  Get plenty of rest and sleep.  Do not drink alcohol or smoke. SEEK IMMEDIATE MEDICAL CARE IF:   You experience severe cramps or pain in your back or belly (abdomen).  You have an oral temperature above 102 F (38.9 C), not controlled by medicine.  You develop chills.  You have a gush of fluid from the vagina.  You pass large clots or tissue. Save any tissue for your caregiver to inspect.  Your bleeding increases or you become light-headed or weak.  You pass out.  You feel less movement or no movement of the baby. Document Released: 06/24/2002 Document Revised: 06/26/2011 Document Reviewed: 03/01/2009 Exit[employer] Patient Information 2013 [employer].  

## 2012-02-11 ENCOUNTER — Emergency Department (HOSPITAL_COMMUNITY)
Admission: EM | Admit: 2012-02-11 | Discharge: 2012-02-11 | Discharge status: Against Medical Advice | Payer: Medicaid Other | Attending: Emergency Medicine | Admitting: Emergency Medicine

## 2012-02-11 ENCOUNTER — Encounter (HOSPITAL_COMMUNITY): Payer: Self-pay | Admitting: Nurse Practitioner

## 2012-02-11 DIAGNOSIS — R109 Unspecified abdominal pain: Secondary | ICD-10-CM | POA: Insufficient documentation

## 2012-02-11 NOTE — ED Notes (Signed)
Pt c/o upper abd pain since last night. Pt is pregnant, due date nov 23. Pt denies any lower pain, pressure or vaginal discharge.

## 2012-02-11 NOTE — ED Notes (Signed)
Pt requesting to leave AMA. Explained to pt the delay of care. Pt stated, "I will just leave."

## 2012-02-12 ENCOUNTER — Ambulatory Visit (INDEPENDENT_AMBULATORY_CARE_PROVIDER_SITE_OTHER): Payer: Medicaid Other | Admitting: Advanced Practice Midwife

## 2012-02-12 VITALS — BP 128/85 | Temp 97.1°F | Wt 151.0 lb

## 2012-02-12 DIAGNOSIS — O09299 Supervision of pregnancy with other poor reproductive or obstetric history, unspecified trimester: Secondary | ICD-10-CM

## 2012-02-12 DIAGNOSIS — Z3490 Encounter for supervision of normal pregnancy, unspecified, unspecified trimester: Secondary | ICD-10-CM

## 2012-02-12 DIAGNOSIS — K219 Gastro-esophageal reflux disease without esophagitis: Secondary | ICD-10-CM

## 2012-02-12 LAB — POCT URINALYSIS DIP (DEVICE)
Bilirubin Urine: NEGATIVE
Glucose, UA: NEGATIVE mg/dL
Hgb urine dipstick: NEGATIVE
Ketones, ur: NEGATIVE mg/dL
Nitrite: NEGATIVE
Protein, ur: NEGATIVE mg/dL
Specific Gravity, Urine: 1.01 (ref 1.005–1.030)
Urobilinogen, UA: 1 mg/dL (ref 0.0–1.0)
pH: 6.5 (ref 5.0–8.0)

## 2012-02-12 MED ORDER — PANTOPRAZOLE SODIUM 20 MG PO TBEC: 20.0000 mg | DELAYED_RELEASE_TABLET | Freq: Every day | ORAL | Status: DC

## 2012-02-12 NOTE — Progress Notes (Signed)
Per Dr Debroah Loop, does not need to stay in [facility] clinic just due to hx preecl.  Doing well today except acid reflux still bothers her. Epigastric pain is worse with deep inspiration. Has acid coming up. Stopped med a week ago. Will change to Protonix and use Tums PRN

## 2012-02-12 NOTE — Patient Instructions (Signed)
Reflujo gastroesofgico - Adultos  (Gastroesophageal Reflux Disease, Adult)  El reflujo gastroesofgico ocurre cuando el cido del estmago pasa al esfago. Cuando el cido entra en contacto con el esfago, el cido provoca dolor (inflamacin) en el esfago. Con el tiempo, pueden formarse pequeos agujeros (lceras) en el revestimiento del esfago. CAUSAS   Exceso de peso corporal. Esto aplica presin sobre el estmago, lo que hace que el cido del estmago suba hacia el esfago.  El hbito de fumar Aumenta la produccin de cido en el estmago.  El consumo de alcohol. Provoca disminucin de la presin en el esfnter esofgico inferior (vlvula o anillo de msculo entre el esfago y el estmago), permitiendo que el cido del estmago suba hacia el esfago.  Cenas a ltima hora del da y estmago lleno. Aumenta la presin y la produccin de cido en el estmago.  Malformacin en el esfnter esofgico inferior. A menudo no se halla causa.  SNTOMAS   Ardor y dolor en la parte inferior del pecho detrs del esternn y en la zona media del estmago. Puede ocurrir dos veces por semana o ms a menudo.  Dificultad para tragar.  Dolor de garganta.  Tos seca.  Sntomas similares al asma que incluyen sensacin de opresin en el pecho, falta de aire y sibilancias. DIAGNSTICO  El mdico diagnosticar el problema basndose en los sntomas. En algunos casos, se indican radiografas y otras pruebas para verificar si hay complicaciones o para comprobar el estado del estmago y el esfago.  TRATAMIENTO  El mdico le indicar medicamentos de venta libre o recetados para ayudar a disminuir la produccin de cido. Consulte con su mdico antes de empezar o agregar cualquier medicamento nuevo.  INSTRUCCIONES PARA EL CUIDADO EN EL HOGAR   Modifique los factores que pueda cambiar. Consulte con su mdico para solicitar orientacin relacionada con la prdida de peso, dejar de fumar y el consumo de  alcohol.  Evite las comidas y bebidas que empeoran los problemas, como:  Bebidas con cafena o alcohlicas.  Chocolate.  Sabores a menta.  Ajo y cebolla.  Comidas muy condimentadas.  Ctricos como naranjas, limones o limas.  Alimentos que contengan tomate, como salsas, chile y pizza.  Alimentos fritos y grasos.  Evite acostarse durante 3 horas antes de irse a dormir o antes de tomar una siesta.  Haga comidas pequeas durante el da en lugar de 3 comidas abundantes.  Use ropas sueltas. No use nada apretado alrededor de la cintura que cause presin en el estmago.  Levante (eleve) la cabecera de la cama 6 a 8 pulgadas (15 a 20 cm) con bloques de madera. Usar almohadas extra no ayuda.  Solo tome medicamentos que se pueden comprar sin receta o recetados para el dolor, malestar o fiebre, como le indica el mdico.  No tome aspirina, ibuprofeno ni antiinflamatorios no esteroides. SOLICITE ATENCIN MDICA DE INMEDIATO SI:   Siente dolor en los brazos, el cuello, la mandbula, los dientes o la espalda.  El dolor aumenta o cambia la intensidad o la durancin.  Tiene nuseas, vmitos o sudoracin(diaforesis).  Siente falta de aire o dolor en el pecho, o se desmaya.  Vomita y el vmito tiene sangre, es de color verde, amarillo, negro o es similar a la borra del caf o tiene sangre.  Las heces son rojas, sanguinolentas o negras. Estos sntomas pueden ser signos de otros problemas, como enfermedades cardacas, hemorragias gstrias o sangrado esofgico.  ASEGRESE DE QUE:   Comprende estas instrucciones.  Controlar su enfermedad.    Solicitar ayuda de inmediato si no mejora o si empeora. Document Released: 01/11/2005 Document Revised: 06/26/2011 [employer] Patient Information 2013 [employer].  

## 2012-02-12 NOTE — Progress Notes (Signed)
Pulse 70  Edema trace in feet. C/o epigastric pain since yesterday morning. No pressure.

## 2012-02-13 LAB — GC/CHLAMYDIA PROBE AMP, GENITAL
Chlamydia, DNA Probe: NEGATIVE
GC Probe Amp, Genital: NEGATIVE

## 2012-02-14 ENCOUNTER — Encounter: Payer: Medicaid Other | Admitting: Advanced Practice Midwife

## 2012-02-15 ENCOUNTER — Encounter (HOSPITAL_COMMUNITY): Payer: Self-pay | Admitting: Anesthesiology

## 2012-02-15 ENCOUNTER — Inpatient Hospital Stay (HOSPITAL_COMMUNITY): Payer: Medicaid Other | Admitting: Anesthesiology

## 2012-02-15 ENCOUNTER — Inpatient Hospital Stay (HOSPITAL_COMMUNITY)
Admission: AD | Admit: 2012-02-15 | Discharge: 2012-02-19 | DRG: 774 | Disposition: A | Discharge status: Home / Self Care | Payer: Medicaid Other | Source: Ambulatory Visit | Attending: Obstetrics & Gynecology | Admitting: Obstetrics & Gynecology

## 2012-02-15 ENCOUNTER — Encounter (HOSPITAL_COMMUNITY): Payer: Self-pay | Admitting: *Deleted

## 2012-02-15 DIAGNOSIS — O1420 HELLP syndrome (HELLP), unspecified trimester: Secondary | ICD-10-CM | POA: Diagnosis present

## 2012-02-15 DIAGNOSIS — IMO0002 Reserved for concepts with insufficient information to code with codable children: Secondary | ICD-10-CM

## 2012-02-15 DIAGNOSIS — O1414 Severe pre-eclampsia complicating childbirth: Principal | ICD-10-CM | POA: Diagnosis present

## 2012-02-15 DIAGNOSIS — O09299 Supervision of pregnancy with other poor reproductive or obstetric history, unspecified trimester: Secondary | ICD-10-CM

## 2012-02-15 DIAGNOSIS — Z3490 Encounter for supervision of normal pregnancy, unspecified, unspecified trimester: Secondary | ICD-10-CM

## 2012-02-15 DIAGNOSIS — O328XX0 Maternal care for other malpresentation of fetus, not applicable or unspecified: Secondary | ICD-10-CM | POA: Diagnosis present

## 2012-02-15 DIAGNOSIS — D696 Thrombocytopenia, unspecified: Secondary | ICD-10-CM | POA: Diagnosis present

## 2012-02-15 DIAGNOSIS — O209 Hemorrhage in early pregnancy, unspecified: Secondary | ICD-10-CM

## 2012-02-15 DIAGNOSIS — D689 Coagulation defect, unspecified: Secondary | ICD-10-CM | POA: Diagnosis present

## 2012-02-15 LAB — COMPREHENSIVE METABOLIC PANEL
ALT: 83 U/L — ABNORMAL HIGH (ref 0–35)
ALT: 127 U/L — ABNORMAL HIGH (ref 0–35)
AST: 65 U/L — ABNORMAL HIGH (ref 0–37)
AST: 124 U/L — ABNORMAL HIGH (ref 0–37)
Albumin: 2.5 g/dL — ABNORMAL LOW (ref 3.5–5.2)
Albumin: 2.7 g/dL — ABNORMAL LOW (ref 3.5–5.2)
Alkaline Phosphatase: 208 U/L — ABNORMAL HIGH (ref 39–117)
Alkaline Phosphatase: 225 U/L — ABNORMAL HIGH (ref 39–117)
BUN: 13 mg/dL (ref 6–23)
BUN: 11 mg/dL (ref 6–23)
CO2: 20 mEq/L (ref 19–32)
CO2: 17 mEq/L — ABNORMAL LOW (ref 19–32)
Calcium: 8.7 mg/dL (ref 8.4–10.5)
Calcium: 8.5 mg/dL (ref 8.4–10.5)
Chloride: 102 mEq/L (ref 96–112)
Chloride: 102 mEq/L (ref 96–112)
Creatinine, Ser: 0.85 mg/dL (ref 0.50–1.10)
Creatinine, Ser: 0.79 mg/dL (ref 0.50–1.10)
GFR calc Af Amer: >90 mL/min (ref >90)
GFR calc Af Amer: >90 mL/min (ref >90)
GFR calc non Af Amer: >90 mL/min (ref >90)
GFR calc non Af Amer: >90 mL/min (ref >90)
Glucose, Bld: 96 mg/dL (ref 70–99)
Glucose, Bld: 104 mg/dL — ABNORMAL HIGH (ref 70–99)
Potassium: 3.5 mEq/L (ref 3.5–5.1)
Potassium: 3.4 mEq/L — ABNORMAL LOW (ref 3.5–5.1)
Sodium: 134 mEq/L — ABNORMAL LOW (ref 135–145)
Sodium: 133 mEq/L — ABNORMAL LOW (ref 135–145)
Total Bilirubin: 0.6 mg/dL (ref 0.3–1.2)
Total Bilirubin: 1.1 mg/dL (ref 0.3–1.2)
Total Protein: 6.5 g/dL (ref 6.0–8.3)
Total Protein: 6.8 g/dL (ref 6.0–8.3)

## 2012-02-15 LAB — PROTEIN / CREATININE RATIO, URINE
Creatinine, Urine: 88.13 mg/dL
Protein Creatinine Ratio: 0.34 — ABNORMAL HIGH (ref 0.00–0.15)
Total Protein, Urine: 29.6 mg/dL

## 2012-02-15 LAB — URINALYSIS, ROUTINE W REFLEX MICROSCOPIC
Bilirubin Urine: NEGATIVE
Glucose, UA: NEGATIVE mg/dL
Hgb urine dipstick: NEGATIVE
Ketones, ur: NEGATIVE mg/dL
Nitrite: NEGATIVE
Protein, ur: NEGATIVE mg/dL
Specific Gravity, Urine: 1.01 (ref 1.005–1.030)
Urobilinogen, UA: 0.2 mg/dL (ref 0.0–1.0)
pH: 6 (ref 5.0–8.0)

## 2012-02-15 LAB — CBC WITH DIFFERENTIAL/PLATELET
Basophils Absolute: 0 10*3/uL (ref 0.0–0.1)
Basophils Relative: 0 % (ref 0–1)
Eosinophils Absolute: 0.1 10*3/uL (ref 0.0–0.7)
Eosinophils Relative: 1 % (ref 0–5)
HCT: 34.8 % — ABNORMAL LOW (ref 36.0–46.0)
Hemoglobin: 12.5 g/dL (ref 12.0–15.0)
Lymphocytes Relative: 19 % (ref 12–46)
Lymphs Abs: 1.8 10*3/uL (ref 0.7–4.0)
MCH: 32.1 pg (ref 26.0–34.0)
MCHC: 35.9 g/dL (ref 30.0–36.0)
MCV: 89.2 fL (ref 78.0–100.0)
Monocytes Absolute: 0.7 10*3/uL (ref 0.1–1.0)
Monocytes Relative: 7 % (ref 3–12)
Neutro Abs: 6.6 10*3/uL (ref 1.7–7.7)
Neutrophils Relative %: 73 % (ref 43–77)
Platelets: 93 10*3/uL — ABNORMAL LOW (ref 150–400)
RBC: 3.9 MIL/uL (ref 3.87–5.11)
RDW: 13.5 % (ref 11.5–15.5)
WBC: 9.1 10*3/uL (ref 4.0–10.5)

## 2012-02-15 LAB — PREPARE RBC (CROSSMATCH)

## 2012-02-15 LAB — CBC
HCT: 36.5 % (ref 36.0–46.0)
Hemoglobin: 13.3 g/dL (ref 12.0–15.0)
MCH: 32.4 pg (ref 26.0–34.0)
MCHC: 36.4 g/dL — ABNORMAL HIGH (ref 30.0–36.0)
MCV: 88.8 fL (ref 78.0–100.0)
Platelets: 93 10*3/uL — ABNORMAL LOW (ref 150–400)
RBC: 4.11 MIL/uL (ref 3.87–5.11)
RDW: 13.6 % (ref 11.5–15.5)
WBC: 12 10*3/uL — ABNORMAL HIGH (ref 4.0–10.5)

## 2012-02-15 LAB — URINE MICROSCOPIC-ADD ON

## 2012-02-15 LAB — OB RESULTS CONSOLE GBS: GBS: NEGATIVE

## 2012-02-15 LAB — URIC ACID: Uric Acid, Serum: 5.6 mg/dL (ref 2.4–7.0)

## 2012-02-15 LAB — CULTURE, BETA STREP (GROUP B ONLY)

## 2012-02-15 LAB — ABO/RH: ABO/RH(D): O POS

## 2012-02-15 MED ORDER — MAGNESIUM SULFATE BOLUS VIA INFUSION
6.0000 g | Freq: Once | INTRAVENOUS | Status: DC
  Filled 2012-02-15: qty 500

## 2012-02-15 MED ORDER — CITRIC ACID-SODIUM CITRATE 334-500 MG/5ML PO SOLN: 30.0000 mL | ORAL | Status: DC | PRN

## 2012-02-15 MED ORDER — GI COCKTAIL ~~LOC~~: 30.0000 mL | Freq: Once | ORAL | Status: DC

## 2012-02-15 MED ORDER — LACTATED RINGERS IV SOLN: 500.0000 mL | INTRAVENOUS | Status: DC | PRN

## 2012-02-15 MED ORDER — PANTOPRAZOLE SODIUM 20 MG PO TBEC
20.0000 mg | DELAYED_RELEASE_TABLET | Freq: Every day | ORAL | Status: DC
  Filled 2012-02-15: qty 1

## 2012-02-15 MED ORDER — EPHEDRINE 5 MG/ML INJ
10.0000 mg | INTRAVENOUS | Status: DC | PRN
  Filled 2012-02-15: qty 4

## 2012-02-15 MED ORDER — TERBUTALINE SULFATE 1 MG/ML IJ SOLN: 0.2500 mg | Freq: Once | INTRAMUSCULAR | Status: AC | PRN

## 2012-02-15 MED ORDER — NALBUPHINE SYRINGE 5 MG/0.5 ML
5.0000 mg | INJECTION | INTRAMUSCULAR | Status: DC | PRN
  Administered 2012-02-15 – 2012-02-16 (×2): 5 mg via INTRAVENOUS
  Filled 2012-02-15 (×2): qty 0.5

## 2012-02-15 MED ORDER — DIPHENHYDRAMINE HCL 50 MG/ML IJ SOLN: 12.5000 mg | INTRAMUSCULAR | Status: DC | PRN

## 2012-02-15 MED ORDER — PHENYLEPHRINE 40 MCG/ML (10ML) SYRINGE FOR IV PUSH (FOR BLOOD PRESSURE SUPPORT)
80.0000 ug | PREFILLED_SYRINGE | INTRAVENOUS | Status: DC | PRN
  Filled 2012-02-15: qty 5

## 2012-02-15 MED ORDER — MISOPROSTOL 25 MCG QUARTER TABLET
25.0000 ug | ORAL_TABLET | ORAL | Status: DC | PRN
  Administered 2012-02-15: 25 ug via VAGINAL
  Filled 2012-02-15: qty 0.25

## 2012-02-15 MED ORDER — MAGNESIUM SULFATE 40 G IN LACTATED RINGERS - SIMPLE
2.0000 g/h | INTRAVENOUS | Status: DC
  Filled 2012-02-15: qty 500

## 2012-02-15 MED ORDER — EPHEDRINE 5 MG/ML INJ: 10.0000 mg | INTRAVENOUS | Status: DC | PRN

## 2012-02-15 MED ORDER — LACTATED RINGERS IV SOLN
INTRAVENOUS | Status: DC
  Administered 2012-02-15: 19:00:00 via INTRAVENOUS

## 2012-02-15 MED ORDER — PHENYLEPHRINE 40 MCG/ML (10ML) SYRINGE FOR IV PUSH (FOR BLOOD PRESSURE SUPPORT): 80.0000 ug | PREFILLED_SYRINGE | INTRAVENOUS | Status: DC | PRN

## 2012-02-15 MED ORDER — LIDOCAINE HCL (PF) 1 % IJ SOLN
30.0000 mL | INTRAMUSCULAR | Status: DC | PRN
  Filled 2012-02-15: qty 30

## 2012-02-15 MED ORDER — FENTANYL 2.5 MCG/ML BUPIVACAINE 1/10 % EPIDURAL INFUSION (WH - ANES)
14.0000 mL/h | INTRAMUSCULAR | Status: DC
  Filled 2012-02-15: qty 125

## 2012-02-15 MED ORDER — LACTATED RINGERS IV SOLN
500.0000 mL | Freq: Once | INTRAVENOUS | Status: AC
  Administered 2012-02-16: 500 mL via INTRAVENOUS

## 2012-02-15 MED ORDER — OXYTOCIN BOLUS FROM INFUSION
500.0000 mL | INTRAVENOUS | Status: DC
  Administered 2012-02-16: 500 mL via INTRAVENOUS

## 2012-02-15 MED ORDER — MAGNESIUM SULFATE BOLUS VIA INFUSION
4.0000 g | Freq: Once | INTRAVENOUS | Status: AC
  Administered 2012-02-15: 4 g via INTRAVENOUS
  Filled 2012-02-15: qty 500

## 2012-02-15 MED ORDER — LABETALOL HCL 5 MG/ML IV SOLN: 20.0000 mg | INTRAVENOUS | Status: DC | PRN

## 2012-02-15 MED ORDER — NALBUPHINE SYRINGE 5 MG/0.5 ML: 10.0000 mg | INJECTION | INTRAMUSCULAR | Status: DC | PRN

## 2012-02-15 MED ORDER — OXYTOCIN 40 UNITS IN LACTATED RINGERS INFUSION - SIMPLE MED
62.5000 mL/h | INTRAVENOUS | Status: DC
  Filled 2012-02-15: qty 1000

## 2012-02-15 MED ORDER — ONDANSETRON HCL 4 MG/2ML IJ SOLN: 4.0000 mg | Freq: Four times a day (QID) | INTRAMUSCULAR | Status: DC | PRN

## 2012-02-15 MED ORDER — IBUPROFEN 600 MG PO TABS
600.0000 mg | ORAL_TABLET | Freq: Four times a day (QID) | ORAL | Status: DC | PRN
  Filled 2012-02-15: qty 1

## 2012-02-15 NOTE — MAU Note (Signed)
Pain for over a wk.  Initially was epigastric, now more right flank to mid back. Constant.

## 2012-02-15 NOTE — H&P (Signed)
Call for BP >160/105 Pr:Cr ratio .35  Attestation of Attending Supervision of Resident: Evaluation and management procedures were performed by the [facility] Resident under my supervision.  I have reviewed the resident's note and chart, and I agree with the management and plan except the changes noted above.Anibal Henderson, M.D. 02/15/2012 8:56 PM

## 2012-02-15 NOTE — Progress Notes (Addendum)
Patient ID: Andrea Singh, female   DOB: 04-28-1985, 26 y.o.   MRN: [id number]  Getting "dry cath" placed for future epidural by Dr Arby Barrette, given her thrombocytopenia  AVSS Filed Vitals:   02/15/12 2016 02/15/12 2028 02/15/12 2031 02/15/12 2059  BP: 160/97 146/100 137/82 151/98  Pulse: 86 91 93 91  Temp:      TempSrc:      Resp: 24 20  24   Height:      Weight:       Trace edema   Now complains of pain being more in epigastric and LUQ area with referred pain to left shoulder  Difficult to percuss edges of organs, but no over hepatomegaly appreciated.  EFW 6-6.5 lbs  FHR reassuring UCs every 2-3 min.  Will continue plan of care Repeat labs 6 hrs after last set

## 2012-02-15 NOTE — Anesthesia Procedure Notes (Signed)
Epidural Patient location during procedure: OB Start time: 02/15/2012 9:30 PM End time: 02/15/2012 9:32 PM  Staffing Anesthesiologist: Sandrea Hughs  Preanesthetic Checklist Completed: patient identified, site marked, surgical consent, pre-op evaluation, timeout performed, IV checked, risks and benefits discussed and monitors and equipment checked  Epidural Patient position: sitting Prep: site prepped and draped and DuraPrep Patient monitoring: continuous pulse ox and blood pressure Approach: midline Injection technique: LOR air  Needle:  Needle type: Tuohy  Needle gauge: 17 G Needle length: 9 cm and 9 Needle insertion depth: 5 cm cm Catheter type: closed end flexible Catheter size: 19 Gauge Catheter at skin depth: 10 cm Test dose: negative  Assessment Events: blood not aspirated, injection not painful, no injection resistance, negative IV test and no paresthesia

## 2012-02-15 NOTE — H&P (Signed)
Andrea Singh is a 26 y.o. female G2P1001 [redacted]w[redacted]d presenting for RUQ and epigastric pain with HTN.  She has a history of PreE with her previous pregnancy and was induced at 36 weeks. She has a history of severe GERD. She has stopped her reflux medication 2 weeks ago, because she did not feel it was working well. She reports her pain started about 10 days ago with midline epigastric pain, just below the xiphoid process. It has since moved towards her RUQ and is currently down on her right side. She reports no elevated BP's with this pregnancy prior or chronically. She denies changes in vision or headache today. She did experience a headache yesterday. She reports her ankles get swollen at the end of each day. She denies problems with this pregnancy. She denies leak, gush of fluid or vaginal bleeding.   Maternal Medical History:  Reason for admission: Reason for Admission:   nauseaPrenatal complications: Hypertension and thrombocytopenia.   Prenatal Complications - Diabetes: none.    OB History    Grav Para Term Preterm Abortions TAB SAB Ect Mult Living   2 1 1  0      1     Past Medical History  Diagnosis Date  . Hypertension     with preg in 2008   History reviewed. No pertinent past surgical history. Family History: family history is not on file. Social History:  reports that she has never smoked. She has never used smokeless tobacco. She reports that she does not drink alcohol or use illicit drugs.   Prenatal Transfer Tool  Maternal Diabetes: No Genetic Screening: Normal Maternal Ultrasounds/Referrals: Normal Fetal Ultrasounds or other Referrals:  Other:  Initially fetal renal anomaly, since cleared Maternal Substance Abuse:  No Significant Maternal Medications:  None Significant Maternal Lab Results:  Lab values include: Group B Strep negative Other Comments:  None  Review of Systems  Constitutional: Positive for malaise/fatigue. Negative for fever and chills.  HENT: Positive  for neck pain.   Eyes: Negative for blurred vision, double vision and photophobia.  Respiratory: Positive for shortness of breath.   Cardiovascular: Negative for chest pain and palpitations.  Gastrointestinal: Positive for abdominal pain. Negative for heartburn, nausea, vomiting and diarrhea.  Genitourinary: Negative for dysuria and urgency.  Musculoskeletal: Positive for back pain.  Neurological: Positive for headaches. Negative for dizziness and weakness.  All other systems reviewed and are negative.      Blood pressure 173/95, pulse 67, temperature 99.2 F (37.3 C), temperature source Oral, resp. rate 18, last menstrual period 06/03/2011. Exam Physical Exam  Nursing note and vitals reviewed.  Constitutional: She is oriented to person, place, and time. She appears well-developed and well-nourished. No distress.  HENT:  Head: Normocephalic and atraumatic.  Eyes: Conjunctivae normal and EOM are normal. Pupils are equal, round, and reactive to light. Right eye exhibits no discharge. Left eye exhibits no discharge. No scleral icterus.  Neck: Normal range of motion. Neck supple. Pain in her left shoulder, Muscular tightness and tenderness noted.  Cardiovascular: Normal rate, regular rhythm, normal heart sounds and intact distal pulses. Exam reveals no gallop and no friction rub.  No murmur heard.  Respiratory: Effort normal and breath sounds normal. No respiratory distress. She has no wheezes. She has no rales. She exhibits no tenderness.  GI: Soft. Bowel sounds are normal. She exhibits no distension and no mass. There is tenderness. There is no rebound and no guarding.  NO TTP epigastric or RUQ TTP right lateral rib area  towards her back.  Musculoskeletal: Normal range of motion. She exhibits no edema and no tenderness.  NO CVA tenderness NO edema noted Neurological: She is alert and oriented to person, place, and time.  Skin: Skin is warm and dry. She is not diaphoretic.    Psychiatric: She has a normal mood and affect.   Prenatal labs: ABO, Rh: O/POS/-- (05/01 4098) Antibody: NEG (05/01 0934) Rubella: 79.2 (05/01 0934) RPR: NON REAC (09/18 1038)  HBsAg: NEGATIVE (05/01 0934)  HIV: NON REACTIVE (09/18 1038)  GBS:   Negative Results for orders placed during the hospital encounter of 02/15/12 (from the past 24 hour(s))  URINALYSIS, ROUTINE W REFLEX MICROSCOPIC     Status: Abnormal   Collection Time   02/15/12  4:00 PM      Component Value Range   Color, Urine YELLOW  YELLOW   APPearance CLEAR  CLEAR   Specific Gravity, Urine 1.010  1.005 - 1.030   pH 6.0  5.0 - 8.0   Glucose, UA NEGATIVE  NEGATIVE mg/dL   Hgb urine dipstick NEGATIVE  NEGATIVE   Bilirubin Urine NEGATIVE  NEGATIVE   Ketones, ur NEGATIVE  NEGATIVE mg/dL   Protein, ur NEGATIVE  NEGATIVE mg/dL   Urobilinogen, UA 0.2  0.0 - 1.0 mg/dL   Nitrite NEGATIVE  NEGATIVE   Leukocytes, UA SMALL (*) NEGATIVE  URINE MICROSCOPIC-ADD ON     Status: Abnormal   Collection Time   02/15/12  4:00 PM      Component Value Range   Squamous Epithelial / LPF FEW (*) RARE   WBC, UA 3-6  <3 WBC/hpf  CBC WITH DIFFERENTIAL     Status: Abnormal   Collection Time   02/15/12  4:58 PM      Component Value Range   WBC 9.1  4.0 - 10.5 K/uL   RBC 3.90  3.87 - 5.11 MIL/uL   Hemoglobin 12.5  12.0 - 15.0 g/dL   HCT 11.9 (*) 14.7 - 82.9 %   MCV 89.2  78.0 - 100.0 fL   MCH 32.1  26.0 - 34.0 pg   MCHC 35.9  30.0 - 36.0 g/dL   RDW 56.2  13.0 - 86.5 %   Platelets 93 (*) 150 - 400 K/uL   Neutrophils Relative 73  43 - 77 %   Neutro Abs 6.6  1.7 - 7.7 K/uL   Lymphocytes Relative 19  12 - 46 %   Lymphs Abs 1.8  0.7 - 4.0 K/uL   Monocytes Relative 7  3 - 12 %   Monocytes Absolute 0.7  0.1 - 1.0 K/uL   Eosinophils Relative 1  0 - 5 %   Eosinophils Absolute 0.1  0.0 - 0.7 K/uL   Basophils Relative 0  0 - 1 %   Basophils Absolute 0.0  0.0 - 0.1 K/uL  COMPREHENSIVE METABOLIC PANEL     Status: Abnormal   Collection Time    02/15/12  4:58 PM      Component Value Range   Sodium 134 (*) 135 - 145 mEq/L   Potassium 3.5  3.5 - 5.1 mEq/L   Chloride 102  96 - 112 mEq/L   CO2 20  19 - 32 mEq/L   Glucose, Bld 96  70 - 99 mg/dL   BUN 13  6 - 23 mg/dL   Creatinine, Ser 7.84  0.50 - 1.10 mg/dL   Calcium 8.7  8.4 - 69.6 mg/dL   Total Protein 6.5  6.0 - 8.3 g/dL  Albumin 2.5 (*) 3.5 - 5.2 g/dL   AST 65 (*) 0 - 37 U/L   ALT 83 (*) 0 - 35 U/L   Alkaline Phosphatase 208 (*) 39 - 117 U/L   Total Bilirubin 0.6  0.3 - 1.2 mg/dL   GFR calc non Af Amer >90  >90 mL/min   GFR calc Af Amer >90  >90 mL/min  URIC ACID     Status: Normal   Collection Time   02/15/12  4:58 PM      Component Value Range   Uric Acid, Serum 5.6  2.4 - 7.0 mg/dL    Assessment/Plan: 1. 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  - MgSO4 6g bolus; MgSo4 Q 2 hr 2. IOL: admit to LD with routine orders 3. GBS negative 4. HTN: Call if >170/110 5. Protein:Cr ratio pending 6.Anticipate vaginal delivery  Felix Pacini 02/15/2012, 6:21 PM

## 2012-02-15 NOTE — Progress Notes (Signed)
Pt informed about dry catheter option that can be done now and will insure that she can get an epidural, also told that if she so chooses she can wait till she is in pain and have platelet count redrawn but that there is a chance they will be too low at that time to get an epidural, pt opts for dry catheter placement

## 2012-02-15 NOTE — Anesthesia Preprocedure Evaluation (Signed)
Anesthesia Evaluation  Patient identified by MRN, date of birth, ID band Patient awake    Reviewed: Allergy & Precautions, H&P , NPO status , Patient's Chart, lab work & pertinent test results  Airway Mallampati: I TM Distance: >3 FB Neck ROM: full    Dental No notable dental hx.    Pulmonary neg pulmonary ROS,    Pulmonary exam normal       Cardiovascular hypertension, Pt. on medications     Neuro/Psych negative neurological ROS  negative psych ROS   GI/Hepatic Neg liver ROS, GERD-  Medicated,  Endo/Other  negative endocrine ROS  Renal/GU negative Renal ROS  negative genitourinary   Musculoskeletal negative musculoskeletal ROS (+)   Abdominal Normal abdominal exam  (+)   Peds  Hematology negative hematology ROS (+)   Anesthesia Other Findings   Reproductive/Obstetrics (+) Pregnancy                           Anesthesia Physical Anesthesia Plan  ASA: III  Anesthesia Plan: Epidural   Post-op Pain Management:    Induction:   Airway Management Planned:   Additional Equipment:   Intra-op Plan:   Post-operative Plan:   Informed Consent: I have reviewed the patients History and Physical, chart, labs and discussed the procedure including the risks, benefits and alternatives for the proposed anesthesia with the patient or authorized representative who has indicated his/her understanding and acceptance.     Plan Discussed with:   Anesthesia Plan Comments: (1. Placing dry cath in this patient with HEELP syndrome and a platelet count of 93K four hours ago. She is undergoing induction of labor with Cytotec tonight.)        Anesthesia Quick Evaluation

## 2012-02-16 ENCOUNTER — Encounter (HOSPITAL_COMMUNITY): Payer: Self-pay | Admitting: *Deleted

## 2012-02-16 DIAGNOSIS — O328XX0 Maternal care for other malpresentation of fetus, not applicable or unspecified: Secondary | ICD-10-CM

## 2012-02-16 DIAGNOSIS — D689 Coagulation defect, unspecified: Secondary | ICD-10-CM

## 2012-02-16 DIAGNOSIS — D696 Thrombocytopenia, unspecified: Secondary | ICD-10-CM

## 2012-02-16 DIAGNOSIS — O9912 Other diseases of the blood and blood-forming organs and certain disorders involving the immune mechanism complicating childbirth: Secondary | ICD-10-CM

## 2012-02-16 DIAGNOSIS — O1420 HELLP syndrome (HELLP), unspecified trimester: Secondary | ICD-10-CM | POA: Diagnosis present

## 2012-02-16 DIAGNOSIS — O1414 Severe pre-eclampsia complicating childbirth: Secondary | ICD-10-CM

## 2012-02-16 LAB — COMPREHENSIVE METABOLIC PANEL
ALT: 186 U/L — ABNORMAL HIGH (ref 0–35)
ALT: 181 U/L — ABNORMAL HIGH (ref 0–35)
ALT: 163 U/L — ABNORMAL HIGH (ref 0–35)
AST: 210 U/L — ABNORMAL HIGH (ref 0–37)
AST: 185 U/L — ABNORMAL HIGH (ref 0–37)
AST: 158 U/L — ABNORMAL HIGH (ref 0–37)
Albumin: 2 g/dL — ABNORMAL LOW (ref 3.5–5.2)
Albumin: 2.1 g/dL — ABNORMAL LOW (ref 3.5–5.2)
Albumin: 2.1 g/dL — ABNORMAL LOW (ref 3.5–5.2)
Alkaline Phosphatase: 188 U/L — ABNORMAL HIGH (ref 39–117)
Alkaline Phosphatase: 191 U/L — ABNORMAL HIGH (ref 39–117)
Alkaline Phosphatase: 176 U/L — ABNORMAL HIGH (ref 39–117)
BUN: 11 mg/dL (ref 6–23)
BUN: 10 mg/dL (ref 6–23)
BUN: 9 mg/dL (ref 6–23)
CO2: 20 mEq/L (ref 19–32)
CO2: 21 mEq/L (ref 19–32)
CO2: 21 mEq/L (ref 19–32)
Calcium: 7.2 mg/dL — ABNORMAL LOW (ref 8.4–10.5)
Calcium: 7 mg/dL — ABNORMAL LOW (ref 8.4–10.5)
Calcium: 7 mg/dL — ABNORMAL LOW (ref 8.4–10.5)
Chloride: 102 mEq/L (ref 96–112)
Chloride: 103 mEq/L (ref 96–112)
Chloride: 103 mEq/L (ref 96–112)
Creatinine, Ser: 0.89 mg/dL (ref 0.50–1.10)
Creatinine, Ser: 0.83 mg/dL (ref 0.50–1.10)
Creatinine, Ser: 0.77 mg/dL (ref 0.50–1.10)
GFR calc Af Amer: >90 mL/min (ref >90)
GFR calc Af Amer: >90 mL/min (ref >90)
GFR calc Af Amer: >90 mL/min (ref >90)
GFR calc non Af Amer: 90 mL/min — ABNORMAL LOW (ref >90)
GFR calc non Af Amer: >90 mL/min (ref >90)
GFR calc non Af Amer: >90 mL/min (ref >90)
Glucose, Bld: 121 mg/dL — ABNORMAL HIGH (ref 70–99)
Glucose, Bld: 109 mg/dL — ABNORMAL HIGH (ref 70–99)
Glucose, Bld: 169 mg/dL — ABNORMAL HIGH (ref 70–99)
Potassium: 3.4 mEq/L — ABNORMAL LOW (ref 3.5–5.1)
Potassium: 3.6 mEq/L (ref 3.5–5.1)
Potassium: 3.2 mEq/L — ABNORMAL LOW (ref 3.5–5.1)
Sodium: 133 mEq/L — ABNORMAL LOW (ref 135–145)
Sodium: 135 mEq/L (ref 135–145)
Sodium: 135 mEq/L (ref 135–145)
Total Bilirubin: 1.6 mg/dL — ABNORMAL HIGH (ref 0.3–1.2)
Total Bilirubin: 1.8 mg/dL — ABNORMAL HIGH (ref 0.3–1.2)
Total Bilirubin: 1.5 mg/dL — ABNORMAL HIGH (ref 0.3–1.2)
Total Protein: 5.2 g/dL — ABNORMAL LOW (ref 6.0–8.3)
Total Protein: 5.4 g/dL — ABNORMAL LOW (ref 6.0–8.3)
Total Protein: 5.8 g/dL — ABNORMAL LOW (ref 6.0–8.3)

## 2012-02-16 LAB — CBC
HCT: 30.3 % — ABNORMAL LOW (ref 36.0–46.0)
HCT: 31.1 % — ABNORMAL LOW (ref 36.0–46.0)
HCT: 33.2 % — ABNORMAL LOW (ref 36.0–46.0)
HCT: 33.3 % — ABNORMAL LOW (ref 36.0–46.0)
HCT: 35 % — ABNORMAL LOW (ref 36.0–46.0)
Hemoglobin: 10.8 g/dL — ABNORMAL LOW (ref 12.0–15.0)
Hemoglobin: 11.1 g/dL — ABNORMAL LOW (ref 12.0–15.0)
Hemoglobin: 12 g/dL (ref 12.0–15.0)
Hemoglobin: 12.1 g/dL (ref 12.0–15.0)
Hemoglobin: 12.7 g/dL (ref 12.0–15.0)
MCH: 31.6 pg (ref 26.0–34.0)
MCH: 31.7 pg (ref 26.0–34.0)
MCH: 31.7 pg (ref 26.0–34.0)
MCH: 32.1 pg (ref 26.0–34.0)
MCH: 32.2 pg (ref 26.0–34.0)
MCHC: 35.6 g/dL (ref 30.0–36.0)
MCHC: 35.7 g/dL (ref 30.0–36.0)
MCHC: 36 g/dL (ref 30.0–36.0)
MCHC: 36.3 g/dL — ABNORMAL HIGH (ref 30.0–36.0)
MCHC: 36.4 g/dL — ABNORMAL HIGH (ref 30.0–36.0)
MCV: 88.1 fL (ref 78.0–100.0)
MCV: 88.1 fL (ref 78.0–100.0)
MCV: 88.6 fL (ref 78.0–100.0)
MCV: 88.6 fL (ref 78.0–100.0)
MCV: 88.9 fL (ref 78.0–100.0)
Platelets: 43 10*3/uL — ABNORMAL LOW (ref 150–400)
Platelets: 45 10*3/uL — ABNORMAL LOW (ref 150–400)
Platelets: 45 10*3/uL — ABNORMAL LOW (ref 150–400)
Platelets: 49 10*3/uL — ABNORMAL LOW (ref 150–400)
Platelets: 63 10*3/uL — ABNORMAL LOW (ref 150–400)
RBC: 3.41 MIL/uL — ABNORMAL LOW (ref 3.87–5.11)
RBC: 3.51 MIL/uL — ABNORMAL LOW (ref 3.87–5.11)
RBC: 3.77 MIL/uL — ABNORMAL LOW (ref 3.87–5.11)
RBC: 3.78 MIL/uL — ABNORMAL LOW (ref 3.87–5.11)
RBC: 3.95 MIL/uL (ref 3.87–5.11)
RDW: 13.6 % (ref 11.5–15.5)
RDW: 13.6 % (ref 11.5–15.5)
RDW: 13.7 % (ref 11.5–15.5)
RDW: 13.8 % (ref 11.5–15.5)
RDW: 14 % (ref 11.5–15.5)
WBC: 12.8 10*3/uL — ABNORMAL HIGH (ref 4.0–10.5)
WBC: 13.4 10*3/uL — ABNORMAL HIGH (ref 4.0–10.5)
WBC: 14.7 10*3/uL — ABNORMAL HIGH (ref 4.0–10.5)
WBC: 15 10*3/uL — ABNORMAL HIGH (ref 4.0–10.5)
WBC: 16.2 10*3/uL — ABNORMAL HIGH (ref 4.0–10.5)

## 2012-02-16 LAB — MRSA PCR SCREENING: MRSA by PCR: NEGATIVE

## 2012-02-16 LAB — RPR: RPR Ser Ql: NONREACTIVE

## 2012-02-16 MED ORDER — BENZOCAINE-MENTHOL 20-0.5 % EX AERO
1.0000 "application " | INHALATION_SPRAY | CUTANEOUS | Status: DC | PRN
  Filled 2012-02-16: qty 56

## 2012-02-16 MED ORDER — OXYCODONE-ACETAMINOPHEN 5-325 MG PO TABS
1.0000 | ORAL_TABLET | ORAL | Status: DC | PRN
  Administered 2012-02-16 – 2012-02-17 (×5): 1 via ORAL
  Filled 2012-02-16 (×5): qty 1

## 2012-02-16 MED ORDER — LACTATED RINGERS IV SOLN
INTRAVENOUS | Status: DC
  Administered 2012-02-16 – 2012-02-17 (×3): via INTRAVENOUS

## 2012-02-16 MED ORDER — LANOLIN HYDROUS EX OINT: TOPICAL_OINTMENT | CUTANEOUS | Status: DC | PRN

## 2012-02-16 MED ORDER — PRENATAL MULTIVITAMIN CH
1.0000 | ORAL_TABLET | Freq: Every day | ORAL | Status: DC
  Administered 2012-02-16 – 2012-02-19 (×4): 1 via ORAL
  Filled 2012-02-16 (×4): qty 1

## 2012-02-16 MED ORDER — WITCH HAZEL-GLYCERIN EX PADS: 1.0000 "application " | MEDICATED_PAD | CUTANEOUS | Status: DC | PRN

## 2012-02-16 MED ORDER — DIPHENHYDRAMINE HCL 25 MG PO CAPS: 25.0000 mg | ORAL_CAPSULE | Freq: Four times a day (QID) | ORAL | Status: DC | PRN

## 2012-02-16 MED ORDER — ONDANSETRON HCL 4 MG/2ML IJ SOLN: 4.0000 mg | INTRAMUSCULAR | Status: DC | PRN

## 2012-02-16 MED ORDER — ZOLPIDEM TARTRATE 5 MG PO TABS: 5.0000 mg | ORAL_TABLET | Freq: Every evening | ORAL | Status: DC | PRN

## 2012-02-16 MED ORDER — SENNOSIDES-DOCUSATE SODIUM 8.6-50 MG PO TABS
2.0000 | ORAL_TABLET | Freq: Every day | ORAL | Status: DC
  Administered 2012-02-17 – 2012-02-18 (×3): 2 via ORAL

## 2012-02-16 MED ORDER — MAGNESIUM SULFATE 40 G IN LACTATED RINGERS - SIMPLE
2.0000 g/h | INTRAVENOUS | Status: DC
  Administered 2012-02-16 – 2012-02-17 (×2): 2 g/h via INTRAVENOUS
  Filled 2012-02-16 (×3): qty 500

## 2012-02-16 MED ORDER — ONDANSETRON HCL 4 MG PO TABS: 4.0000 mg | ORAL_TABLET | ORAL | Status: DC | PRN

## 2012-02-16 MED ORDER — IBUPROFEN 600 MG PO TABS
600.0000 mg | ORAL_TABLET | Freq: Four times a day (QID) | ORAL | Status: DC
  Administered 2012-02-16: 600 mg via ORAL

## 2012-02-16 MED ORDER — DIBUCAINE 1 % RE OINT
1.0000 "application " | TOPICAL_OINTMENT | RECTAL | Status: DC | PRN
  Filled 2012-02-16: qty 28

## 2012-02-16 MED ORDER — TETANUS-DIPHTH-ACELL PERTUSSIS 5-2.5-18.5 LF-MCG/0.5 IM SUSP
0.5000 mL | Freq: Once | INTRAMUSCULAR | Status: DC
  Filled 2012-02-16: qty 0.5

## 2012-02-16 MED ORDER — SIMETHICONE 80 MG PO CHEW: 80.0000 mg | CHEWABLE_TABLET | ORAL | Status: DC | PRN

## 2012-02-16 NOTE — Progress Notes (Signed)
UR chart review completed.  

## 2012-02-16 NOTE — Progress Notes (Addendum)
Andrea Singh is a 26 y.o. G2P1001 at [redacted]w[redacted]d by ultrasound admitted for HELLP syndrome  Subjective: Feeling return of upper abdominal pain. Not feeling much pain in lower abdomen.  Objective: BP 151/95  Pulse 104  Temp 97.8 F (36.6 C) (Oral)  Resp 20  Ht 5\' 4"  (1.626 m)  Wt 151 lb (68.493 kg)  BMI 25.92 kg/m2  LMP 06/03/2011   Total I/O In: 970.4 [P.O.:300; I.V.:670.4] Out: 150 [Urine:150]  FHT:  FHR: 140 bpm, variability: moderate,  accelerations:  Present,  decelerations:  Absent UC:   irregular, every 2-4 minutes SVE:   Dilation: 3.5 Effacement (%): 80 Station: -3 Exam by:: [place],cnm Head became better applied after amniotomy  Labs: Lab Results  Component Value Date   WBC 12.0* 02/15/2012   HGB 13.3 02/15/2012   HCT 36.5 02/15/2012   MCV 88.8 02/15/2012   PLT 93* 02/15/2012   Results for orders placed during the hospital encounter of 02/15/12 (from the past 24 hour(s))  URINALYSIS, ROUTINE W REFLEX MICROSCOPIC     Status: Abnormal   Collection Time   02/15/12  4:00 PM      Component Value Range   Color, Urine YELLOW  YELLOW   APPearance CLEAR  CLEAR   Specific Gravity, Urine 1.010  1.005 - 1.030   pH 6.0  5.0 - 8.0   Glucose, UA NEGATIVE  NEGATIVE mg/dL   Hgb urine dipstick NEGATIVE  NEGATIVE   Bilirubin Urine NEGATIVE  NEGATIVE   Ketones, ur NEGATIVE  NEGATIVE mg/dL   Protein, ur NEGATIVE  NEGATIVE mg/dL   Urobilinogen, UA 0.2  0.0 - 1.0 mg/dL   Nitrite NEGATIVE  NEGATIVE   Leukocytes, UA SMALL (*) NEGATIVE  PROTEIN / CREATININE RATIO, URINE     Status: Abnormal   Collection Time   02/15/12  4:00 PM      Component Value Range   Creatinine, Urine 88.13     Total Protein, Urine 29.6     PROTEIN CREATININE RATIO 0.34 (*) 0.00 - 0.15  URINE MICROSCOPIC-ADD ON     Status: Abnormal   Collection Time   02/15/12  4:00 PM      Component Value Range   Squamous Epithelial / LPF FEW (*) RARE   WBC, UA 3-6  <3 WBC/hpf  CBC WITH DIFFERENTIAL      Status: Abnormal   Collection Time   02/15/12  4:58 PM      Component Value Range   WBC 9.1  4.0 - 10.5 K/uL   RBC 3.90  3.87 - 5.11 MIL/uL   Hemoglobin 12.5  12.0 - 15.0 g/dL   HCT 16.1 (*) 09.6 - 04.5 %   MCV 89.2  78.0 - 100.0 fL   MCH 32.1  26.0 - 34.0 pg   MCHC 35.9  30.0 - 36.0 g/dL   RDW 40.9  81.1 - 91.4 %   Platelets 93 (*) 150 - 400 K/uL   Neutrophils Relative 73  43 - 77 %   Neutro Abs 6.6  1.7 - 7.7 K/uL   Lymphocytes Relative 19  12 - 46 %   Lymphs Abs 1.8  0.7 - 4.0 K/uL   Monocytes Relative 7  3 - 12 %   Monocytes Absolute 0.7  0.1 - 1.0 K/uL   Eosinophils Relative 1  0 - 5 %   Eosinophils Absolute 0.1  0.0 - 0.7 K/uL   Basophils Relative 0  0 - 1 %   Basophils Absolute 0.0  0.0 -  0.1 K/uL  COMPREHENSIVE METABOLIC PANEL     Status: Abnormal   Collection Time   02/15/12  4:58 PM      Component Value Range   Sodium 134 (*) 135 - 145 mEq/L   Potassium 3.5  3.5 - 5.1 mEq/L   Chloride 102  96 - 112 mEq/L   CO2 20  19 - 32 mEq/L   Glucose, Bld 96  70 - 99 mg/dL   BUN 13  6 - 23 mg/dL   Creatinine, Ser 1.19  0.50 - 1.10 mg/dL   Calcium 8.7  8.4 - 14.7 mg/dL   Total Protein 6.5  6.0 - 8.3 g/dL   Albumin 2.5 (*) 3.5 - 5.2 g/dL   AST 65 (*) 0 - 37 U/L   ALT 83 (*) 0 - 35 U/L   Alkaline Phosphatase 208 (*) 39 - 117 U/L   Total Bilirubin 0.6  0.3 - 1.2 mg/dL   GFR calc non Af Amer >90  >90 mL/min   GFR calc Af Amer >90  >90 mL/min  URIC ACID     Status: Normal   Collection Time   02/15/12  4:58 PM      Component Value Range   Uric Acid, Serum 5.6  2.4 - 7.0 mg/dL  RPR     Status: Normal   Collection Time   02/15/12  5:05 PM      Component Value Range   RPR NON REACTIVE  NON REACTIVE  TYPE AND SCREEN     Status: Normal (Preliminary result)   Collection Time   02/15/12  7:35 PM      Component Value Range   ABO/RH(D) O POS     Antibody Screen NEG     Sample Expiration 02/18/2012     Unit Number [id number]     Blood Component Type RED CELLS,LR     Unit  division 00     Status of Unit ALLOCATED     Transfusion Status OK TO TRANSFUSE     Crossmatch Result Compatible     Unit Number [id number]     Blood Component Type RED CELLS,LR     Unit division 00     Status of Unit ALLOCATED     Transfusion Status OK TO TRANSFUSE     Crossmatch Result Compatible    ABO/RH     Status: Normal   Collection Time   02/15/12  7:35 PM      Component Value Range   ABO/RH(D) O POS    PREPARE RBC (CROSSMATCH)     Status: Normal   Collection Time   02/15/12  8:00 PM      Component Value Range   Order Confirmation ORDER PROCESSED BY BLOOD BANK    CBC     Status: Abnormal   Collection Time   02/15/12 11:20 PM      Component Value Range   WBC 12.0 (*) 4.0 - 10.5 K/uL   RBC 4.11  3.87 - 5.11 MIL/uL   Hemoglobin 13.3  12.0 - 15.0 g/dL   HCT 24.4  01.0 - 27.2 %   MCV 88.8  78.0 - 100.0 fL   MCH 32.4  26.0 - 34.0 pg   MCHC 36.4 (*) 30.0 - 36.0 g/dL   RDW 53.6  64.4 - 03.4 %   Platelets 93 (*) 150 - 400 K/uL  COMPREHENSIVE METABOLIC PANEL     Status: Abnormal   Collection Time   02/15/12 11:20 PM  Component Value Range   Sodium 133 (*) 135 - 145 mEq/L   Potassium 3.4 (*) 3.5 - 5.1 mEq/L   Chloride 102  96 - 112 mEq/L   CO2 17 (*) 19 - 32 mEq/L   Glucose, Bld 104 (*) 70 - 99 mg/dL   BUN 11  6 - 23 mg/dL   Creatinine, Ser 1.61  0.50 - 1.10 mg/dL   Calcium 8.5  8.4 - 09.6 mg/dL   Total Protein 6.8  6.0 - 8.3 g/dL   Albumin 2.7 (*) 3.5 - 5.2 g/dL   AST 045 (*) 0 - 37 U/L   ALT 127 (*) 0 - 35 U/L   Alkaline Phosphatase 225 (*) 39 - 117 U/L   Total Bilirubin 1.1  0.3 - 1.2 mg/dL   GFR calc non Af Amer >90  >90 mL/min   GFR calc Af Amer >90  >90 mL/min     Assessment / Plan: Induction of labor due to HELLP,  progressing well on pitocin  Labor: Progressing normally Preeclampsia:  on magnesium sulfate, no signs or symptoms of toxicity and LFT values elevated since earlier in evening Fetal Wellbeing:  Category I Pain Control:  Nubain I/D:   n/a Anticipated MOD:  NSVD  Amniotomy performed. Clear fluid Expect labor to progress more effectively now.   [facility] 02/16/2012, 12:50 AM

## 2012-02-17 LAB — COMPREHENSIVE METABOLIC PANEL
ALT: 157 U/L — ABNORMAL HIGH (ref 0–35)
ALT: 163 U/L — ABNORMAL HIGH (ref 0–35)
ALT: 164 U/L — ABNORMAL HIGH (ref 0–35)
AST: 120 U/L — ABNORMAL HIGH (ref 0–37)
AST: 139 U/L — ABNORMAL HIGH (ref 0–37)
AST: 149 U/L — ABNORMAL HIGH (ref 0–37)
Albumin: 2 g/dL — ABNORMAL LOW (ref 3.5–5.2)
Albumin: 2 g/dL — ABNORMAL LOW (ref 3.5–5.2)
Albumin: 2.1 g/dL — ABNORMAL LOW (ref 3.5–5.2)
Alkaline Phosphatase: 161 U/L — ABNORMAL HIGH (ref 39–117)
Alkaline Phosphatase: 174 U/L — ABNORMAL HIGH (ref 39–117)
Alkaline Phosphatase: 178 U/L — ABNORMAL HIGH (ref 39–117)
BUN: 8 mg/dL (ref 6–23)
BUN: 8 mg/dL (ref 6–23)
BUN: 9 mg/dL (ref 6–23)
CO2: 22 mEq/L (ref 19–32)
CO2: 23 mEq/L (ref 19–32)
CO2: 24 mEq/L (ref 19–32)
Calcium: 6.4 mg/dL — CL (ref 8.4–10.5)
Calcium: 6.5 mg/dL — ABNORMAL LOW (ref 8.4–10.5)
Calcium: 6.6 mg/dL — ABNORMAL LOW (ref 8.4–10.5)
Chloride: 103 mEq/L (ref 96–112)
Chloride: 104 mEq/L (ref 96–112)
Chloride: 104 mEq/L (ref 96–112)
Creatinine, Ser: 0.69 mg/dL (ref 0.50–1.10)
Creatinine, Ser: 0.76 mg/dL (ref 0.50–1.10)
Creatinine, Ser: 0.84 mg/dL (ref 0.50–1.10)
GFR calc Af Amer: >90 mL/min (ref >90)
GFR calc Af Amer: >90 mL/min (ref >90)
GFR calc Af Amer: >90 mL/min (ref >90)
GFR calc non Af Amer: >90 mL/min (ref >90)
GFR calc non Af Amer: >90 mL/min (ref >90)
GFR calc non Af Amer: >90 mL/min (ref >90)
Glucose, Bld: 115 mg/dL — ABNORMAL HIGH (ref 70–99)
Glucose, Bld: 118 mg/dL — ABNORMAL HIGH (ref 70–99)
Glucose, Bld: 99 mg/dL (ref 70–99)
Potassium: 3.4 mEq/L — ABNORMAL LOW (ref 3.5–5.1)
Potassium: 3.5 mEq/L (ref 3.5–5.1)
Potassium: 3.5 mEq/L (ref 3.5–5.1)
Sodium: 135 mEq/L (ref 135–145)
Sodium: 136 mEq/L (ref 135–145)
Sodium: 137 mEq/L (ref 135–145)
Total Bilirubin: 0.8 mg/dL (ref 0.3–1.2)
Total Bilirubin: 0.9 mg/dL (ref 0.3–1.2)
Total Bilirubin: 1.2 mg/dL (ref 0.3–1.2)
Total Protein: 5.1 g/dL — ABNORMAL LOW (ref 6.0–8.3)
Total Protein: 5.1 g/dL — ABNORMAL LOW (ref 6.0–8.3)
Total Protein: 5.2 g/dL — ABNORMAL LOW (ref 6.0–8.3)

## 2012-02-17 LAB — CBC
HCT: 30.3 % — ABNORMAL LOW (ref 36.0–46.0)
HCT: 28.8 % — ABNORMAL LOW (ref 36.0–46.0)
Hemoglobin: 11 g/dL — ABNORMAL LOW (ref 12.0–15.0)
Hemoglobin: 10.3 g/dL — ABNORMAL LOW (ref 12.0–15.0)
MCH: 32.2 pg (ref 26.0–34.0)
MCH: 32.1 pg (ref 26.0–34.0)
MCHC: 36.3 g/dL — ABNORMAL HIGH (ref 30.0–36.0)
MCHC: 35.8 g/dL (ref 30.0–36.0)
MCV: 88.6 fL (ref 78.0–100.0)
MCV: 89.7 fL (ref 78.0–100.0)
Platelets: 41 10*3/uL — ABNORMAL LOW (ref 150–400)
Platelets: 48 10*3/uL — ABNORMAL LOW (ref 150–400)
RBC: 3.42 MIL/uL — ABNORMAL LOW (ref 3.87–5.11)
RBC: 3.21 MIL/uL — ABNORMAL LOW (ref 3.87–5.11)
RDW: 14 % (ref 11.5–15.5)
RDW: 14.2 % (ref 11.5–15.5)
WBC: 13.6 10*3/uL — ABNORMAL HIGH (ref 4.0–10.5)
WBC: 13 10*3/uL — ABNORMAL HIGH (ref 4.0–10.5)

## 2012-02-17 NOTE — Progress Notes (Addendum)
Post Partum Day 1 Subjective: Patient with RUQ and epigastric pain, headache, dull, and blurry vision.  Otherwise she says she feels better.  Objective: Blood pressure 137/89, pulse 109, temperature 98.1 F (36.7 C), temperature source Oral, resp. rate 20, height 5\' 4"  (1.626 m), weight 147 lb 8 oz (66.906 kg), last menstrual period 06/03/2011, SpO2 97.00%, unknown if currently breastfeeding.    Physical Exam:  General: alert, cooperative and no distress Lochia: appropriate Uterine Fundus: firm Incision: n/a DVT Evaluation: No evidence of DVT seen on physical exam. Negative Homan's sign. No cords or calf tenderness. No significant calf/ankle edema. DTRs 3+ with 2 beats of clonus  Basename 02/17/12 0404 02/16/12 2346  HGB 11.0* 10.8*  HCT 30.3* 30.3*   Recent Results (from the past 4 hour(s))  COMPREHENSIVE METABOLIC PANEL   Collection Time   02/17/12  4:04 AM      Component Value Range   Sodium 135  135 - 145 mEq/L   Potassium 3.5  3.5 - 5.1 mEq/L   Chloride 104  96 - 112 mEq/L   CO2 22  19 - 32 mEq/L   Glucose, Bld 99  70 - 99 mg/dL   BUN 8  6 - 23 mg/dL   Creatinine, Ser 1.61  0.50 - 1.10 mg/dL   Calcium 6.4 (*) 8.4 - 10.5 mg/dL   Total Protein 5.1 (*) 6.0 - 8.3 g/dL   Albumin 2.0 (*) 3.5 - 5.2 g/dL   AST 096 (*) 0 - 37 U/L   ALT 163 (*) 0 - 35 U/L   Alkaline Phosphatase 174 (*) 39 - 117 U/L   Total Bilirubin 0.9  0.3 - 1.2 mg/dL   GFR calc non Af Amer >90  >90 mL/min   GFR calc Af Amer >90  >90 mL/min  CBC   Collection Time   02/17/12  4:04 AM      Component Value Range   WBC 13.6 (*) 4.0 - 10.5 K/uL   RBC 3.42 (*) 3.87 - 5.11 MIL/uL   Hemoglobin 11.0 (*) 12.0 - 15.0 g/dL   HCT 04.5 (*) 40.9 - 81.1 %   MCV 88.6  78.0 - 100.0 fL   MCH 32.2  26.0 - 34.0 pg   MCHC 36.3 (*) 30.0 - 36.0 g/dL   RDW 91.4  78.2 - 95.6 %   Platelets 41 (*) 150 - 400 K/uL    Assessment/Plan: PPD#1 with HELLP syndrome:  Still slow platelet drop and LFTs essentially stable.   Continue MgSO4 until her labs start to improve as well as her symptoms.   LOS: 2 days   EURE,LUTHER H 02/17/2012, 6:56 AM

## 2012-02-17 NOTE — Progress Notes (Signed)
Dr. Despina Hidden notified of CA 6.4. No new orders at this time. Will continue to monitor.

## 2012-02-17 NOTE — Progress Notes (Addendum)
Pt still c/o RUQ pain rates it 5 out 10 constant dull, ache, also with BV. B/P DIAST 92-105.  Per Dr. Despina Hidden order given to continue Magnesium 2 grams. Will continue to monitor.

## 2012-02-18 LAB — CBC
HCT: 28.8 % — ABNORMAL LOW (ref 36.0–46.0)
Hemoglobin: 10.3 g/dL — ABNORMAL LOW (ref 12.0–15.0)
MCH: 32.2 pg (ref 26.0–34.0)
MCHC: 35.8 g/dL (ref 30.0–36.0)
MCV: 90 fL (ref 78.0–100.0)
Platelets: 56 10*3/uL — ABNORMAL LOW (ref 150–400)
RBC: 3.2 MIL/uL — ABNORMAL LOW (ref 3.87–5.11)
RDW: 14.1 % (ref 11.5–15.5)
WBC: 14.2 10*3/uL — ABNORMAL HIGH (ref 4.0–10.5)

## 2012-02-18 MED ORDER — AMLODIPINE BESYLATE 2.5 MG PO TABS
2.5000 mg | ORAL_TABLET | Freq: Every day | ORAL | Status: DC
  Administered 2012-02-18 – 2012-02-19 (×2): 2.5 mg via ORAL
  Filled 2012-02-18 (×2): qty 1

## 2012-02-18 NOTE — Progress Notes (Signed)
Continued to hold Tdap vaccine until platelet count improves. 

## 2012-02-18 NOTE — Progress Notes (Signed)
Post Partum Day 2 Subjective: Minimal epigastric pain. Per anesthesia, cannot remove epidural catheter until her plt count is above 90.  Nursing well.    Objective: Blood pressure 135/86, pulse 82, temperature 98.1 F (36.7 C), temperature source Oral, resp. rate 18, height 5\' 4"  (1.626 m), weight 66.497 kg (146 lb 9.6 oz), last menstrual period 06/03/2011, SpO2 97.00%, unknown if currently breastfeeding.  Physical Exam:  General: alert, cooperative and appears stated age 26: appropriate Uterine Fundus: firm DVT Evaluation: No evidence of DVT seen on physical exam DTR's:  2+, no clonus  CBC    Component Value Date/Time   WBC 14.2* 02/18/2012 0539   RBC 3.20* 02/18/2012 0539   HGB 10.3* 02/18/2012 0539   HCT 28.8* 02/18/2012 0539   PLT 56* 02/18/2012 0539   MCV 90.0 02/18/2012 0539   MCH 32.2 02/18/2012 0539   MCHC 35.8 02/18/2012 0539   RDW 14.1 02/18/2012 0539   LYMPHSABS 1.8 02/15/2012 1658   MONOABS 0.7 02/15/2012 1658   EOSABS 0.1 02/15/2012 1658   BASOSABS 0.0 02/15/2012 1658     Assessment/Plan: Breastfeeding and Contraception none Will discuss with anesthesia and at rounds for formal plan. BP ok for now  LOS: 3 days   PRATT,TANYA S 02/18/2012, 7:25 AM

## 2012-02-19 LAB — TYPE AND SCREEN
ABO/RH(D): O POS
Antibody Screen: NEGATIVE
Unit division: 0
Unit division: 0

## 2012-02-19 LAB — CBC
HCT: 29.6 % — ABNORMAL LOW (ref 36.0–46.0)
Hemoglobin: 10.4 g/dL — ABNORMAL LOW (ref 12.0–15.0)
MCH: 31.9 pg (ref 26.0–34.0)
MCHC: 35.1 g/dL (ref 30.0–36.0)
MCV: 90.8 fL (ref 78.0–100.0)
Platelets: 102 10*3/uL — ABNORMAL LOW (ref 150–400)
RBC: 3.26 MIL/uL — ABNORMAL LOW (ref 3.87–5.11)
RDW: 14.2 % (ref 11.5–15.5)
WBC: 15.7 10*3/uL — ABNORMAL HIGH (ref 4.0–10.5)

## 2012-02-19 MED ORDER — AMLODIPINE BESYLATE 2.5 MG PO TABS: 2.5000 mg | ORAL_TABLET | Freq: Every day | ORAL | Status: DC

## 2012-02-19 MED ORDER — OXYCODONE-ACETAMINOPHEN 5-325 MG PO TABS: 1.0000 | ORAL_TABLET | ORAL | Status: DC | PRN

## 2012-02-19 MED ORDER — WITCH HAZEL-GLYCERIN EX PADS: 1.0000 "application " | MEDICATED_PAD | CUTANEOUS | Status: DC | PRN

## 2012-02-19 MED ORDER — LANOLIN HYDROUS EX OINT: 1.0000 "application " | TOPICAL_OINTMENT | CUTANEOUS | Status: DC | PRN

## 2012-02-19 MED ORDER — BENZOCAINE-MENTHOL 20-0.5 % EX AERO: 1.0000 "application " | INHALATION_SPRAY | CUTANEOUS | Status: DC | PRN

## 2012-02-19 MED ORDER — DIBUCAINE 1 % RE OINT: 1.0000 "application " | TOPICAL_OINTMENT | RECTAL | Status: DC | PRN

## 2012-02-19 NOTE — Discharge Summary (Addendum)
Obstetric Discharge Summary Reason for Admission: induction of labor, HELLP syndrome Prenatal Procedures: ultrasound Intrapartum Procedures: spontaneous vaginal delivery Postpartum Procedures: magnesium Complications-Operative and Postpartum: none Hemoglobin  Date Value Range Status  02/19/2012 10.4* 12.0 - 15.0 g/dL Final     HCT  Date Value Range Status  02/19/2012 29.6* 36.0 - 46.0 % Final    Physical Exam:  General: alert and cooperative Lochia: appropriate Uterine Fundus: firm Incision: none DVT Evaluation: No evidence of DVT seen on physical exam. Negative Homan's sign. No cords or calf tenderness.  Discharge Diagnoses: preterm delivery, IOL for HELLP syndrome  Discharge Information: Date: 02/19/2012 Activity: pelvic rest Diet: routine Medications: Percocet Condition: stable Instructions: AVS Discharge to: home Follow-up Information    Schedule an appointment as soon as possible for a visit in 5 weeks to follow up. (With OB)          Newborn Data: Live born female  Birth Weight: 5 lb 13.7 oz (2655 g) APGAR: 8, 9  Home with mother.  Felix Pacini 02/19/2012, 7:36 AM

## 2012-02-19 NOTE — Anesthesia Postprocedure Evaluation (Signed)
  Anesthesia Post-op Note  Patient: Andrea Singh  Procedure(s) Performed: * No procedures listed *  Patient Location: Mother/Baby  Anesthesia Type:Epidural  Level of Consciousness: awake, alert  and oriented  Airway and Oxygen Therapy: Patient Spontanous Breathing  Post-op Pain: none  Post-op Assessment: Post-op Vital signs reviewed and Patient's Cardiovascular Status Stable  Post-op Vital Signs: Reviewed and stable  Complications: No apparent anesthesia complications

## 2012-02-20 ENCOUNTER — Encounter: Payer: Self-pay | Admitting: *Deleted

## 2012-02-21 ENCOUNTER — Encounter: Payer: Medicaid Other | Admitting: Advanced Practice Midwife

## 2012-03-20 ENCOUNTER — Ambulatory Visit (INDEPENDENT_AMBULATORY_CARE_PROVIDER_SITE_OTHER): Payer: Medicaid Other | Admitting: Obstetrics and Gynecology

## 2012-03-20 ENCOUNTER — Encounter: Payer: Self-pay | Admitting: Obstetrics and Gynecology

## 2012-03-20 MED ORDER — NORETHINDRONE 0.35 MG PO TABS: 1.0000 | ORAL_TABLET | Freq: Every day | ORAL | Status: DC

## 2012-03-20 NOTE — Discharge Summary (Signed)
Agree with above note.  Andrea Singh H. 03/20/2012 8:58 PM

## 2012-03-20 NOTE — Patient Instructions (Signed)

## 2012-03-20 NOTE — Progress Notes (Unsigned)
  Subjective:     Andrea Singh is a 26 y.o. female who presents for a postpartum visit. She is 4 weeks postpartum following a spontaneous vaginal delivery. I have fully reviewed the prenatal and intrapartum course. 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 Plts low of 50's had increased to over 100k at last check. The delivery was at [redacted]w[redacted]d gestational weeks. Outcome: spontaneous vaginal delivery. Anesthesia: epidural. Postpartum course has been uncomplicated. Baby's course has been uncomplicated. Baby is feeding by breast. Bleeding no bleeding. Bowel function is normal. Bladder function is normal. Patient is not sexually active. Contraception method is none. Postpartum depression screening: negative.  The following portions of the patient's history were reviewed and updated as appropriate: allergies, current medications, past family history, past medical history, past social history, past surgical history and problem list.  Review of Systems Pertinent items are noted in HPI.   Objective:    BP 118/70  Pulse 87  Temp 97.9 F (36.6 C) (Oral)  Resp 20  Ht 5\' 3"  (1.6 m)  Wt 136 lb 1.6 oz (61.735 kg)  BMI 24.11 kg/m2  Breastfeeding? Yes  General:  alert, cooperative and no distress   Breasts:  inspection negative, no nipple discharge or bleeding, no masses or nodularity palpable and lactational  Lungs: clear to auscultation bilaterally  Heart:  regular rate and rhythm, S1, S2 normal, no murmur, click, rub or gallop  Abdomen: soft, non-tender; bowel sounds normal; no masses,  no organomegaly 3 cm diastasis   Vulva:  normal  Vagina: normal vagina  Cervix:  no lesions  Corpus: normal size, contour, position, consistency, mobility, non-tender  Adnexa:  normal adnexa  Rectal Exam: Not performed.        Assessment:    4 wks normal postpartum exam. Pap smear not done at today's visit. (normal Pap 01/2012)  Plan:    1. Contraception: oral progesterone-only contraceptive 2. Abdominal tightening  exercises 3. Follow up in: 6 months or as needed.

## 2012-03-21 ENCOUNTER — Telehealth: Payer: Self-pay | Admitting: *Deleted

## 2012-03-21 NOTE — Telephone Encounter (Signed)
Andrea Singh called and states she has questions about birth control pills

## 2012-03-27 NOTE — Telephone Encounter (Signed)
Called Andrea Singh with [facility] and patient states was given prescription for birth control  And started pill Sunday and then had some spotting Monday and period like bleeding now.  Instructed patient to take home pregnancy test, if negative continue pills and we discussed is normal to have irregular  Bleeding as body adjusts, discussed important to take pills at same time everyday and use condoms until finishes first pack pills. If pregnancy test postive call clinic or come to [facility].  Patient voices understanding.

## 2012-04-04 ENCOUNTER — Encounter: Payer: Self-pay | Admitting: *Deleted

## 2013-10-14 IMAGING — US US OB COMP LESS 14 WK
1 series · 13 of 20 positions shown · non-contrast
Comparison: none

[Series 1: us ob comp less 14 wks · 20 acquisitions, 13 frames shown]
[im 1/20]
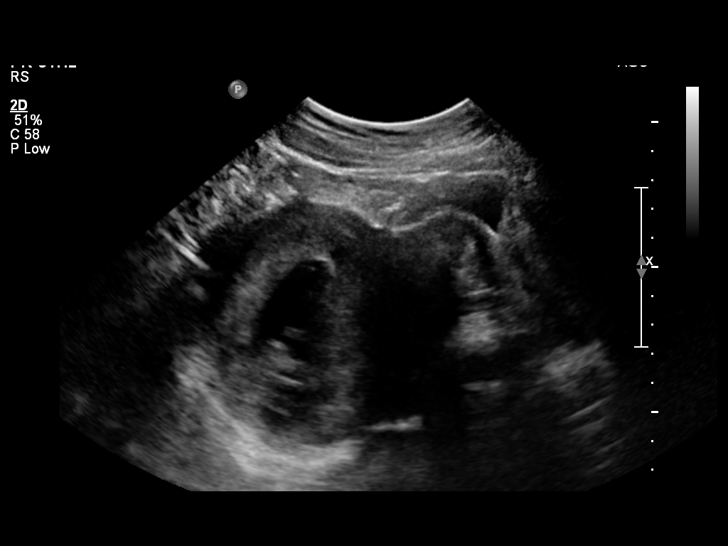
[im 3/20]
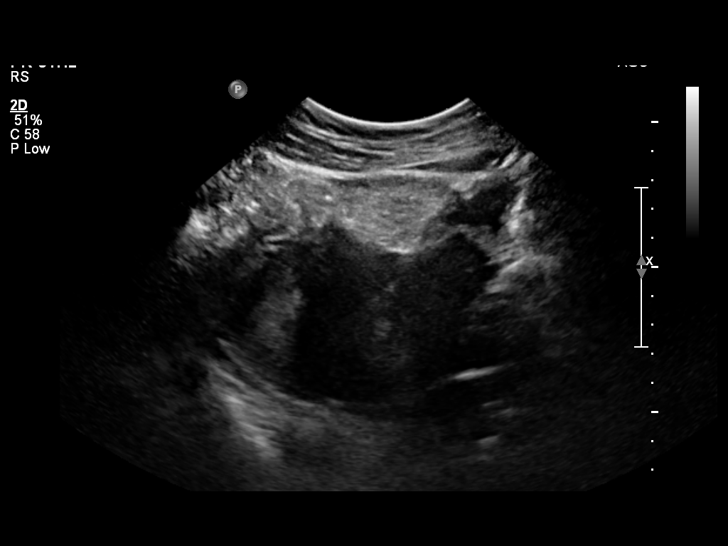
[im 4/20]
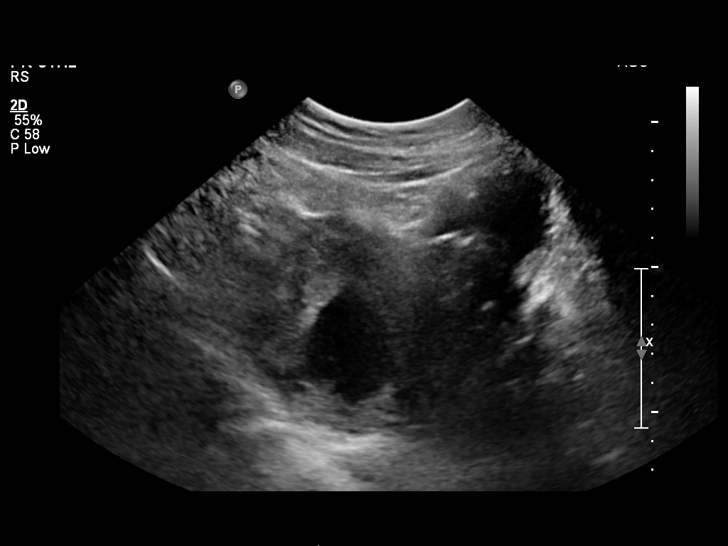
[im 6/20]
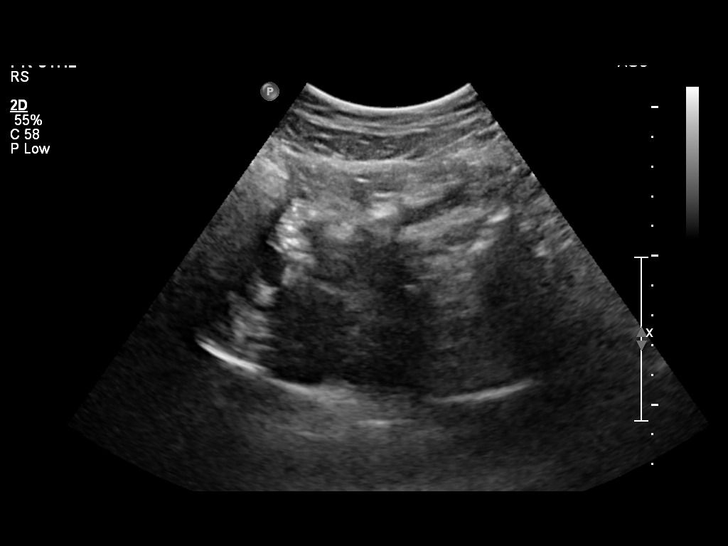
[im 7/20]
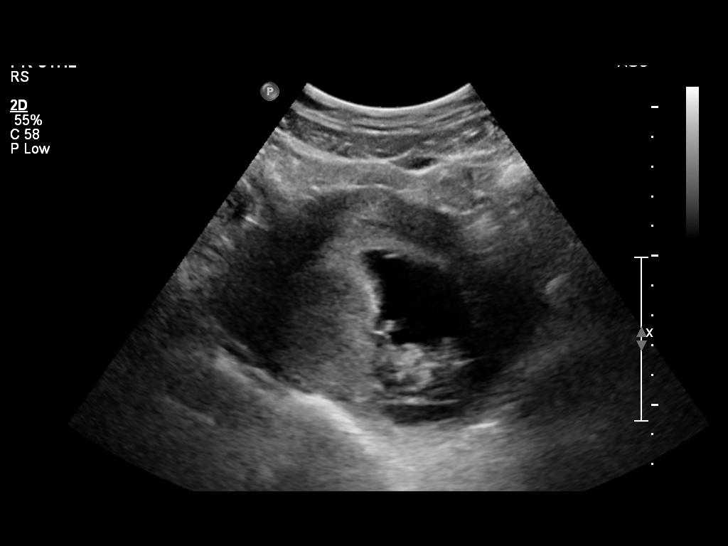
[im 9/20]
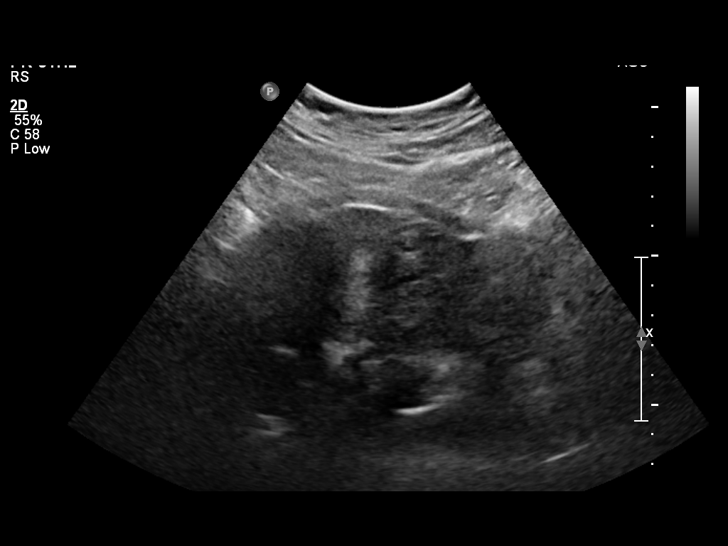
[im 11/20]
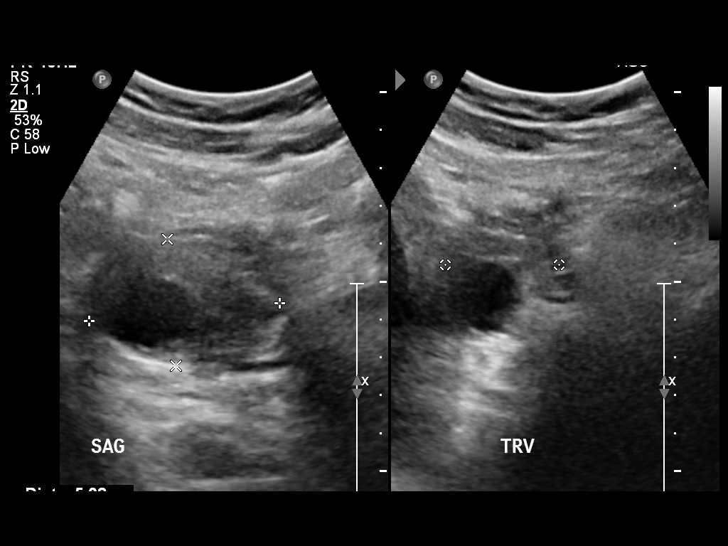
[im 12/20]
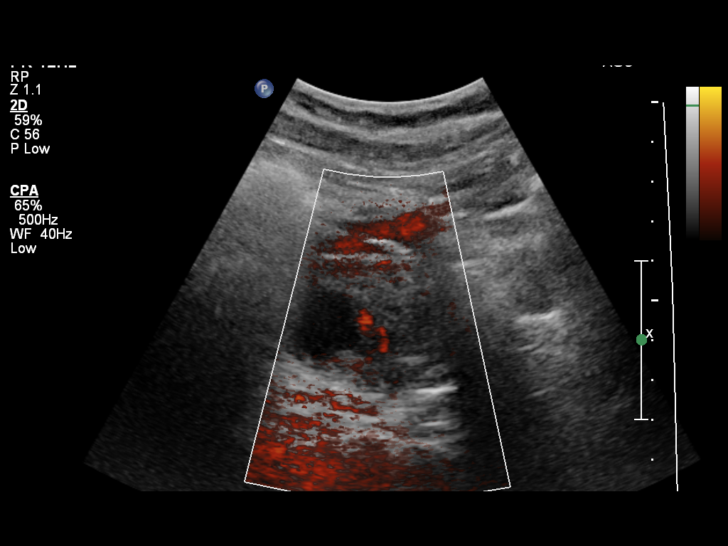
[im 14/20]
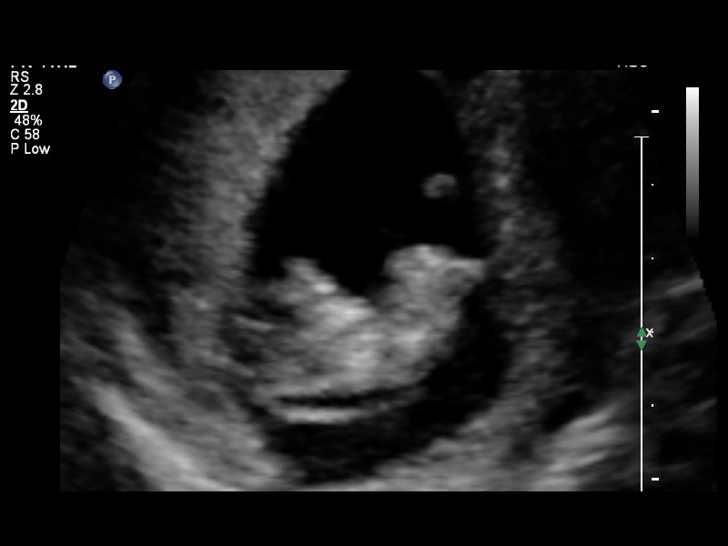
[im 15/20]
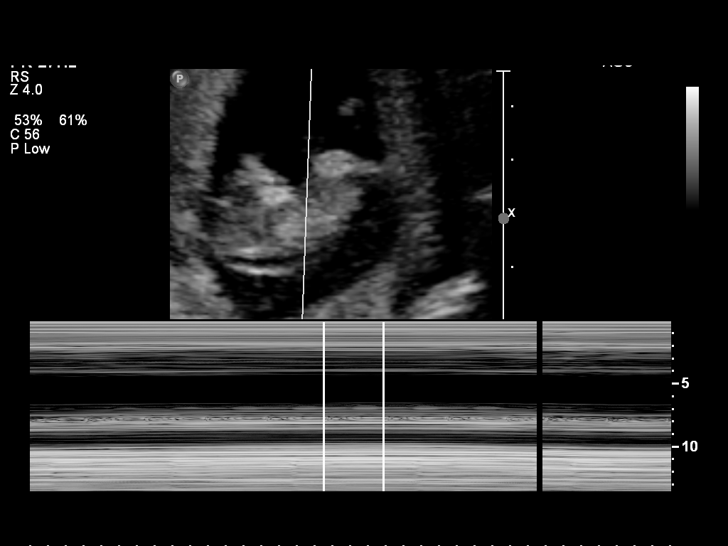
[im 17/20]
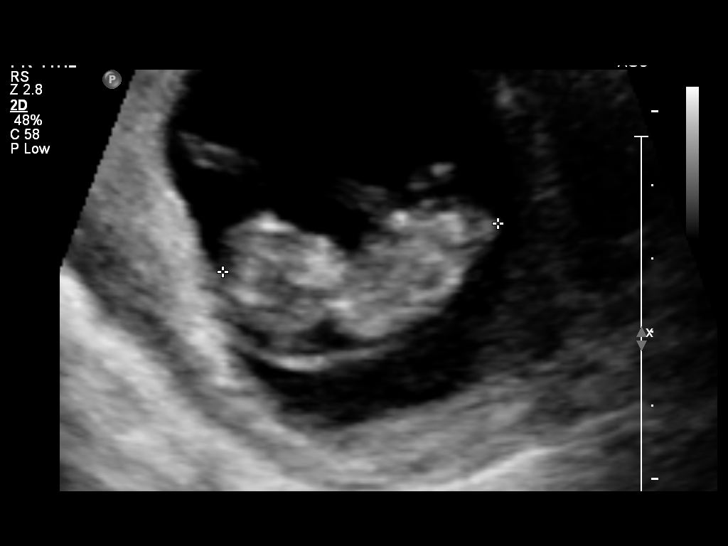
[im 18/20]
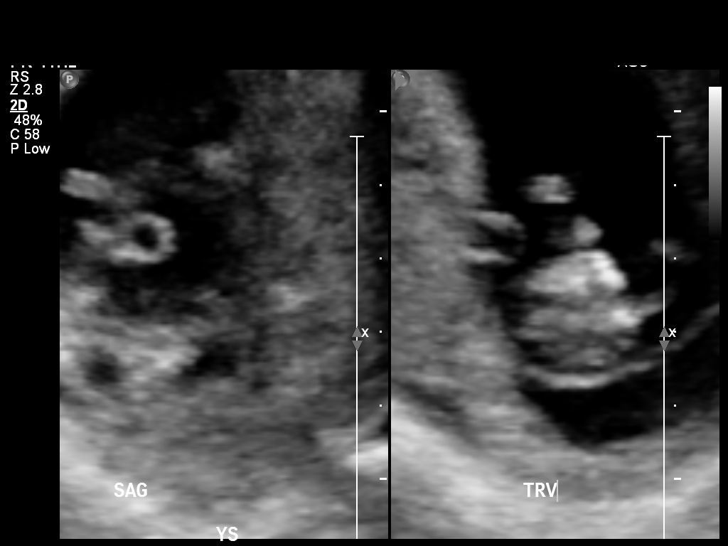
[im 20/20]
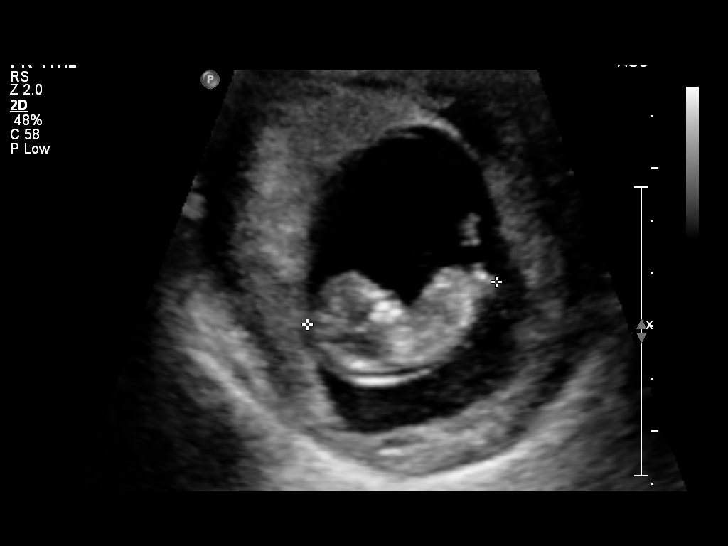

[13 of 20 positions shown; findings below may reference images not displayed]

OBSTETRICS REPORT
                      (Signed Final 08/18/2011 [DATE])

 Order#:         [id number]
Procedures

 US OB COMP LESS 14 WKS                                76801.0
Indications

 Pregnancy with inconclusive fetal viability
 Vaginal bleeding, unknown etiology
 Poor obstetric history: Previous preeclampsia /
 eclampsia/gestational HTN
Fetal Evaluation

 Preg. Location:    Intrauterine
 Gest. Sac:         Intrauterine
 Yolk Sac:          Visualized
 Fetal Pole:        Visualized
 Fetal Heart Rate:  179                         bpm
 Cardiac Activity:  Observed
Biometry

 CRL:     37.3  mm    G. Age:   10w 3d                 EDD:   03/12/12
Gestational Age

 LMP:           10w 5d       Date:   06/04/11                 EDD:   03/10/12
 Best:          10w 5d    Det. By:   LMP  (06/04/11)          EDD:   03/10/12
Cervix Uterus Adnexa

 Cervix:       Closed.
 Uterus:       No abnormality visualized.
 Cul De Sac:   No free fluid seen.

 Left Ovary:   Normal, measuring 5.0 x 3.3 x 3.0cm.  Small corpus
               luteum noted.
 Right Ovary:  Not visualized.
 Adnexa:     No abnormality visualized.
Impression

 There is a single living intrauterine pregancy demonstrating
 an EGA by CRL of  10w 3d. This correlates well with
 expected EGA by of 10w 5d  . Normal left ovary and
 nonvisualized right ovary.

## 2013-10-24 IMAGING — US US OB NUCHAL TRANSLUCENCY 1ST GEST
1 series · 13 of 28 positions shown · non-contrast
Comparison: none

[Series 1: us ob nuchal translucency 1st gest · 0.16mm/px · 13 of 37 slices shown]
[im 2/37]
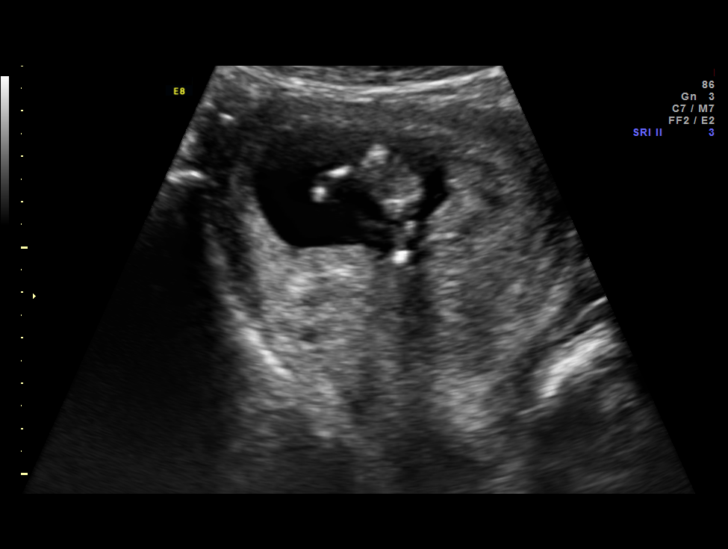
[im 5/37]
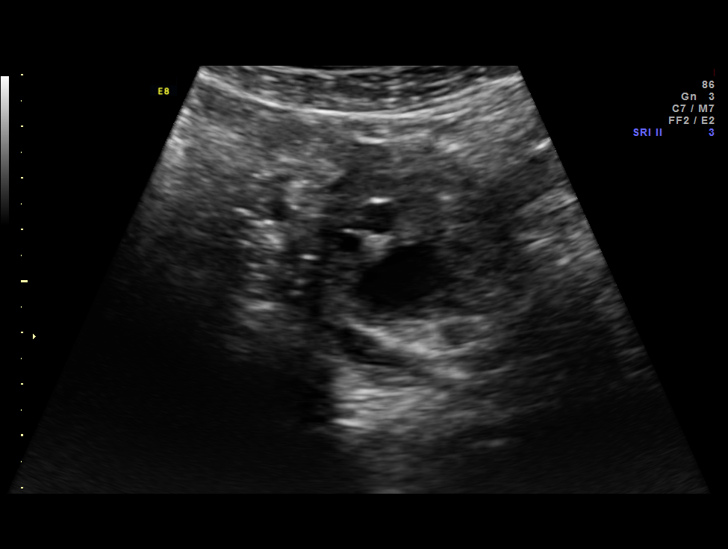
[im 7/37]
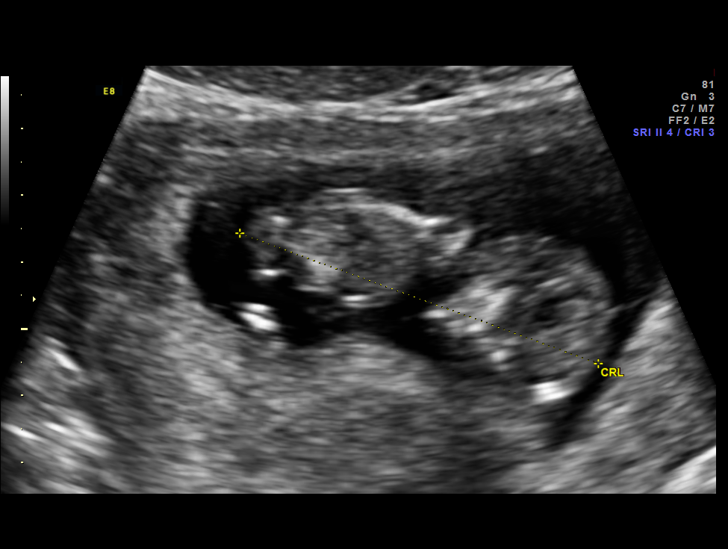
[im 10/37]
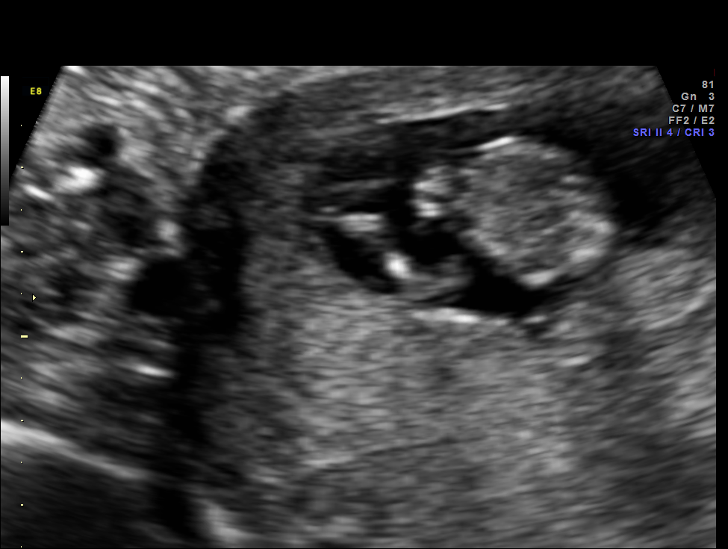
[im 13/37]
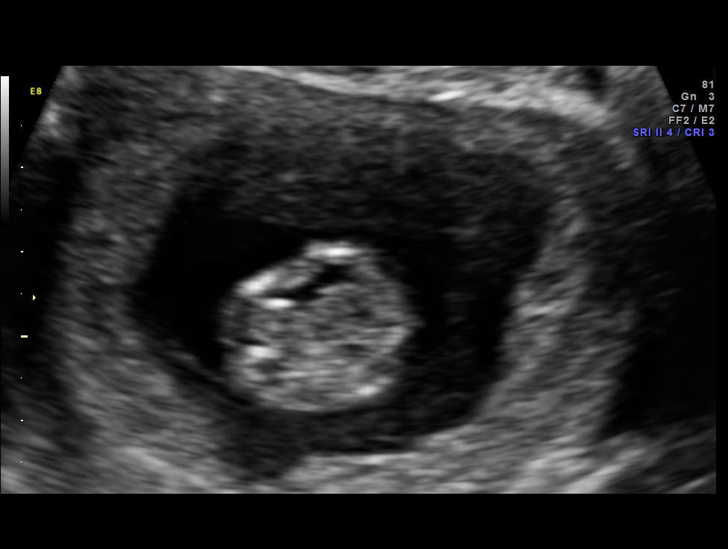
[im 15/37]
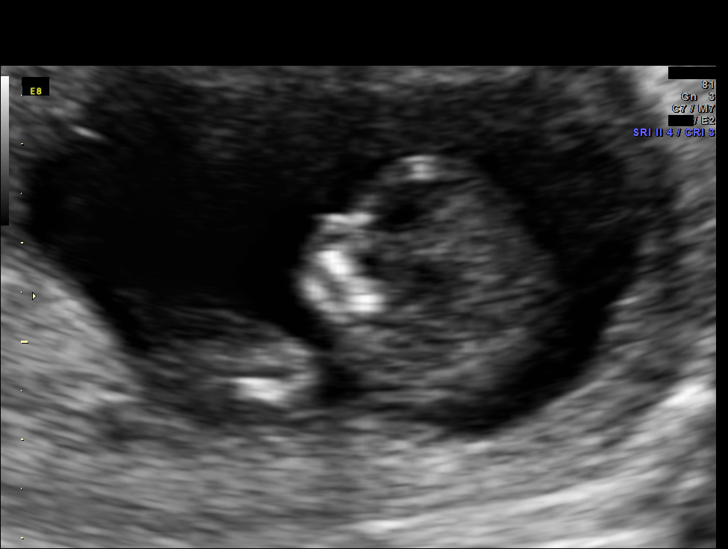
[im 19/37]
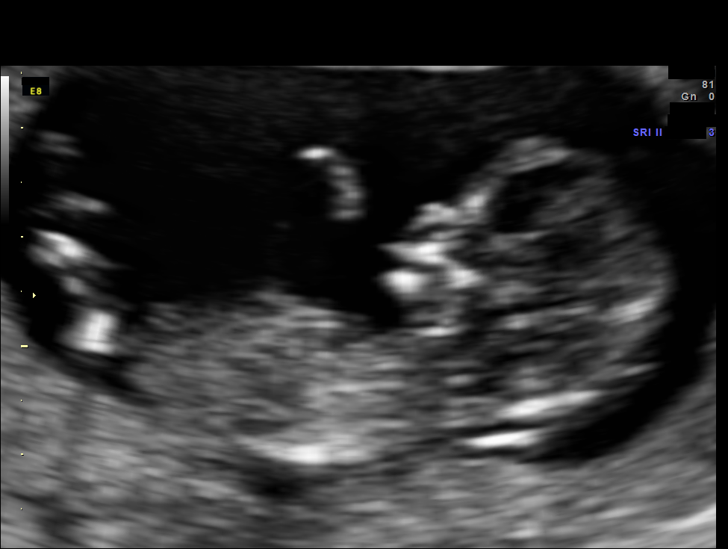
[im 22/37]
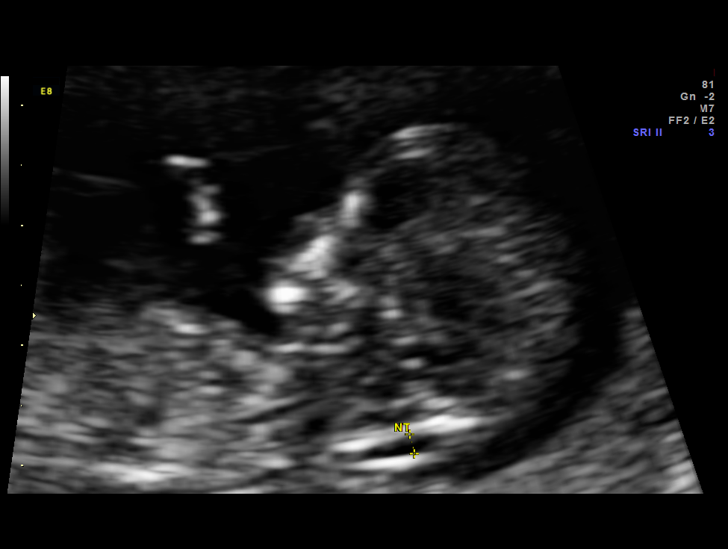
[im 25/37]
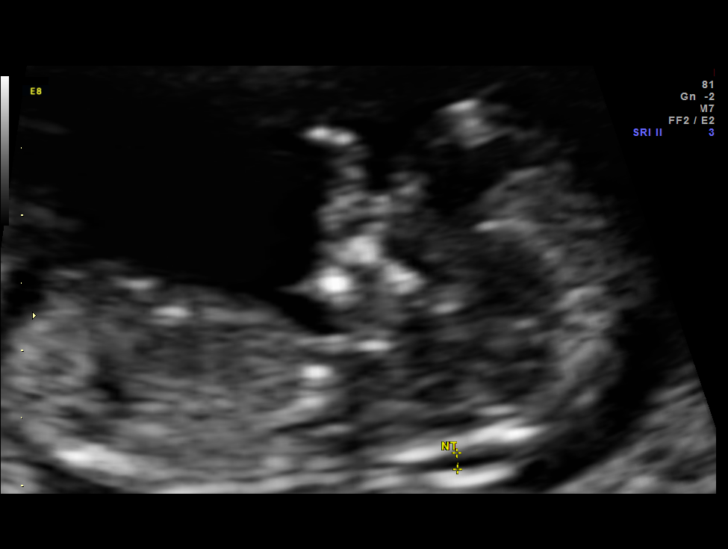
[im 27/37]
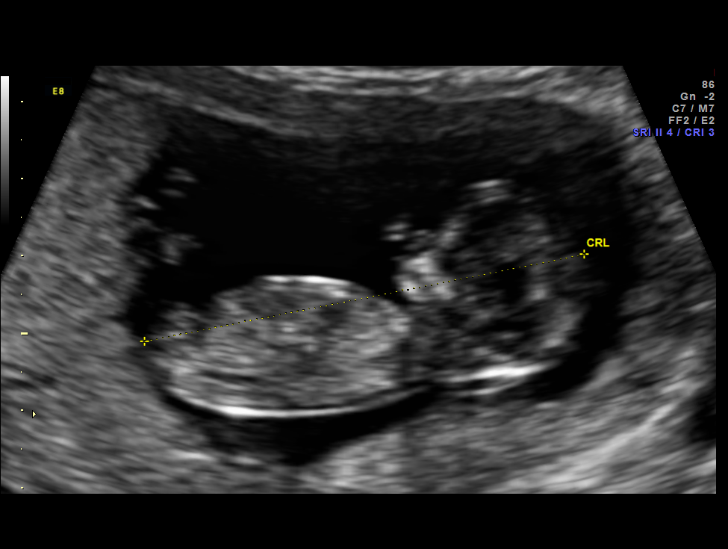
[im 30/37]
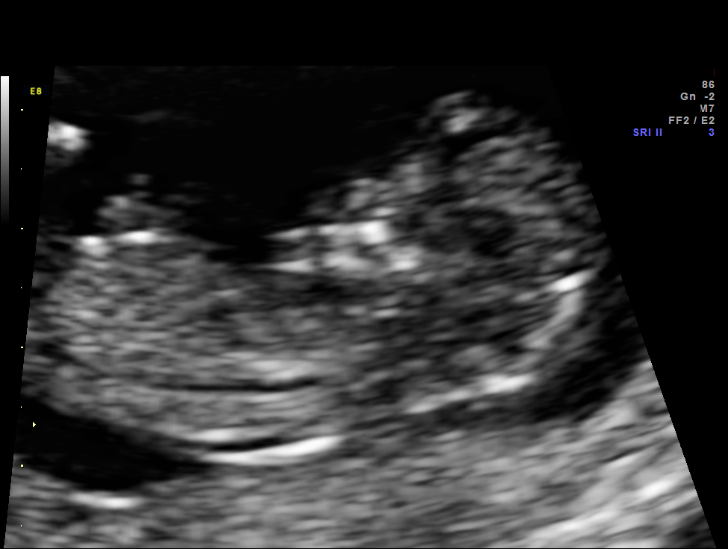
[im 33/37]
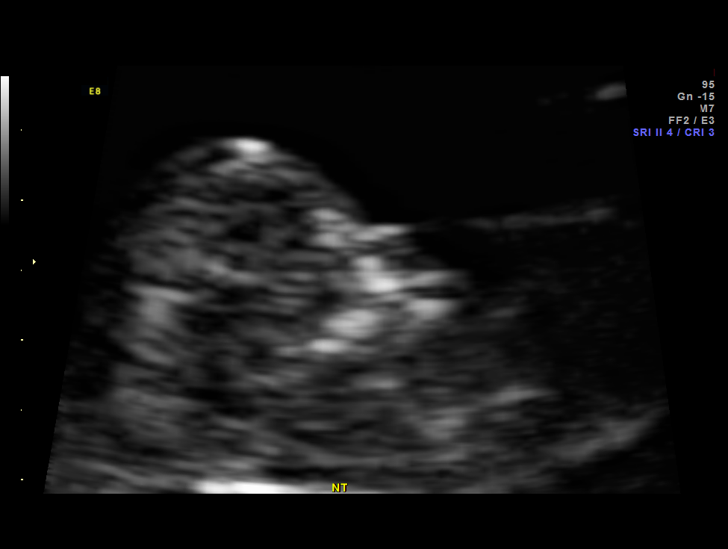
[im 35/37]
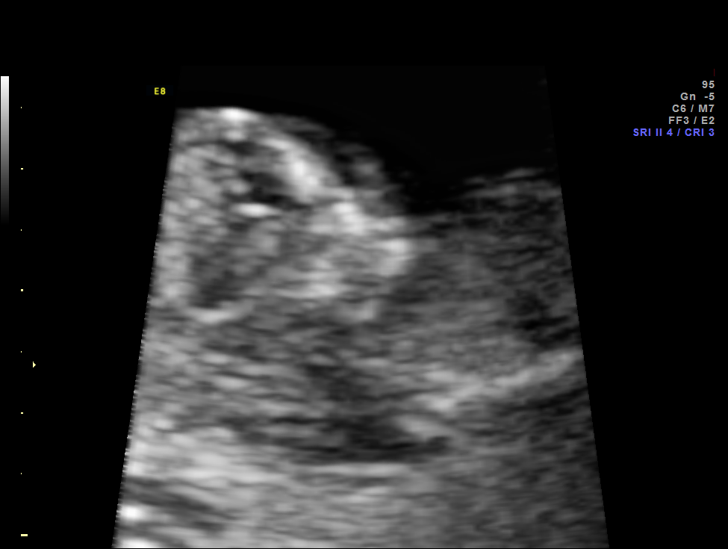

[13 of 28 positions shown; findings below may reference images not displayed]

OBSTETRICS REPORT
                      (Signed Final 08/28/2011 [DATE])

 Order#:         [id number]
Procedures

 US FETAL NUCHAL TRANSLUCENCY                          76813.0
 MEASUREMENT
Indications

 First trimester aneuploidy screen (NT)
 Poor obstetric history: Previous preeclampsia /
 eclampsia/gestational HTN
 Poor obstetric history: Previous preterm delivery
Fetal Evaluation

 Fetal Heart Rate:  169                          bpm
 Cardiac Activity:  Observed
Biometry

 CRL:       58  mm     G. Age:  12w 1d                 EDD:    03/10/12
 NT:       1.8  mm
Gestational Age

 LMP:           12w 1d        Date:  06/04/11                 EDD:   03/10/12
 Best:          12w 1d     Det. By:  LMP  (06/04/11)          EDD:   03/10/12
Cervix Uterus Adnexa

 Cervix:       Normal appearance by transabdominal scan.
 Left Ovary:    Within normal limits.
 Right Ovary:   Not visualized. No adnexal mass visualized.
Impression

 IUP at 12+1 weeks
 No gross abnormalities identified
 NT measurement was within normal limits for this GA; NB
 present
 Normal amniotic fluid volume
 Measurements consistent with LMP dating
Recommendations

 Offer anatomy U/S by 18 weeks
 Offer MSAFP in the second trimester for ONTD screening

## 2014-02-16 ENCOUNTER — Encounter: Payer: Self-pay | Admitting: Obstetrics and Gynecology

## 2015-05-04 LAB — CYTOLOGY - PAP: Pap: NEGATIVE

## 2016-05-31 ENCOUNTER — Encounter: Payer: Self-pay | Admitting: General Practice

## 2016-05-31 ENCOUNTER — Encounter: Payer: Self-pay | Admitting: Obstetrics & Gynecology

## 2016-05-31 ENCOUNTER — Ambulatory Visit (INDEPENDENT_AMBULATORY_CARE_PROVIDER_SITE_OTHER): Payer: BLUE CROSS/BLUE SHIELD | Admitting: Obstetrics & Gynecology

## 2016-05-31 VITALS — BP 125/74 | HR 85 | Wt 153.0 lb

## 2016-05-31 DIAGNOSIS — Z113 Encounter for screening for infections with a predominantly sexual mode of transmission: Secondary | ICD-10-CM | POA: Diagnosis not present

## 2016-05-31 DIAGNOSIS — R102 Pelvic and perineal pain: Secondary | ICD-10-CM | POA: Diagnosis not present

## 2016-05-31 DIAGNOSIS — N83201 Unspecified ovarian cyst, right side: Secondary | ICD-10-CM | POA: Diagnosis not present

## 2016-05-31 DIAGNOSIS — N946 Dysmenorrhea, unspecified: Secondary | ICD-10-CM

## 2016-05-31 MED ORDER — NORGESTIM-ETH ESTRAD TRIPHASIC 0.18/0.215/0.25 MG-35 MCG PO TABS: 1.0000 | ORAL_TABLET | Freq: Every day | ORAL | 11 refills | Status: AC

## 2016-05-31 NOTE — Progress Notes (Signed)
Patient ID: Andrea Singh, female   DOB: [birth year], 31 y.o.   MRN: [id number]  Chief Complaint  Patient presents with  . Dysmenorrhea  dyspareunia, right ovarian cyst   HPI Andrea Singh is a 31 y.o. female.  [id number] Heavy painful menses and postcoital pain. US 03/2016 in [place] showed right ovarian cyst endometrioma vs dermoid HPI  Past Medical History:  Diagnosis Date  . Hypertension    with preg in 2008    Past Surgical History:  Procedure Laterality Date  . NO PAST SURGERIES      Family History  Problem Relation Age of Onset  . Diabetes Mother     Social History Social History  Substance Use Topics  . Smoking status: Never Smoker  . Smokeless tobacco: Never Used  . Alcohol use No    No Known Allergies  Current Outpatient Prescriptions  Medication Sig Dispense Refill  . Norgestimate-Ethinyl Estradiol Triphasic (TRI-SPRINTEC) 0.18/0.215/0.25 MG-35 MCG tablet Take 1 tablet by mouth daily. 1 Package 11   No current facility-administered medications for this visit.     Review of Systems Review of Systems  Constitutional: Negative.   Genitourinary: Positive for dyspareunia, menstrual problem (5-6 days of heavy flow), pelvic pain and vaginal discharge (clear). Negative for vaginal bleeding and vaginal pain.    Blood pressure 125/74, pulse 85, weight 153 lb (69.4 kg), last menstrual period 05/04/2016, unknown if currently breastfeeding.  Physical Exam Physical Exam  Constitutional: She is oriented to person, place, and time. She appears well-developed. No distress.  Cardiovascular: Normal rate.   Pulmonary/Chest: Effort normal.  Abdominal: Soft. She exhibits no mass. There is no tenderness.  Genitourinary: Uterus normal. Vaginal discharge (clear mucus) found.  Genitourinary Comments: Fullness posterior to uterus mildly tender may represent 5 cm adnexal mass. Cx ectropion and erythema but no CMT  Neurological: She is alert and oriented to person, place,  and time.  Psychiatric: She has a normal mood and affect. Her behavior is normal.    Data Reviewed US reviewed from [facility]  Assessment    Pelvic pain - Plan: US Pelvis Complete, US Transvaginal Non-OB, Cervicovaginal ancillary only  Dysmenorrhea - Plan: US Pelvis Complete, US Transvaginal Non-OB, Cervicovaginal ancillary only, Norgestimate-Ethinyl Estradiol Triphasic (TRI-SPRINTEC) 0.18/0.215/0.25 MG-35 MCG tablet  Ovarian cyst, right      Plan    F/u [place] TriSprintec RTC 6 weeks Report if her Sx worsen       Scheryl DarterJames Latondra Gebhart 05/31/2016, 11:37 AM

## 2016-06-02 LAB — CERVICOVAGINAL ANCILLARY ONLY
Bacterial vaginitis: NEGATIVE
Candida vaginitis: NEGATIVE
Chlamydia: NEGATIVE
Neisseria Gonorrhea: NEGATIVE
Trichomonas: NEGATIVE

## 2016-06-09 ENCOUNTER — Ambulatory Visit (HOSPITAL_COMMUNITY)
Admission: RE | Admit: 2016-06-09 | Discharge: 2016-06-09 | Disposition: A | Discharge status: Home / Self Care | Payer: BLUE CROSS/BLUE SHIELD | Source: Ambulatory Visit | Attending: Obstetrics & Gynecology | Admitting: Obstetrics & Gynecology

## 2016-06-09 DIAGNOSIS — R102 Pelvic and perineal pain: Secondary | ICD-10-CM | POA: Insufficient documentation

## 2016-06-09 DIAGNOSIS — N946 Dysmenorrhea, unspecified: Secondary | ICD-10-CM | POA: Diagnosis not present

## 2016-06-12 ENCOUNTER — Telehealth: Payer: Self-pay

## 2016-06-12 NOTE — Telephone Encounter (Signed)
Called patient no answer or voicemail to leave a message at this time.US normal, report if her sx do not improve

## 2016-06-15 NOTE — Telephone Encounter (Signed)
Called patient no answer.

## 2016-06-21 NOTE — Telephone Encounter (Signed)
Called pt w/[facility] # [phone] and informed hLanora Maniser of normal US per Dr. Debroah LoopArnold.  She should report back to us if her sx do not improve.  Pt then asked about her cyst and what happened to it. I reviewed the report and advised pt that the reports does show a cyst of the Rt ovary. I advised pt to discuss further with Dr. Debroah LoopArnold at her scheduled follow up appt on 3/28 @ 1440. She voiced understanding.

## 2016-07-12 ENCOUNTER — Ambulatory Visit: Payer: BLUE CROSS/BLUE SHIELD | Admitting: Obstetrics & Gynecology

## 2016-08-01 ENCOUNTER — Ambulatory Visit (INDEPENDENT_AMBULATORY_CARE_PROVIDER_SITE_OTHER): Payer: BLUE CROSS/BLUE SHIELD | Admitting: Obstetrics & Gynecology

## 2016-08-01 ENCOUNTER — Encounter: Payer: Self-pay | Admitting: Obstetrics & Gynecology

## 2016-08-01 VITALS — BP 117/67 | HR 80 | Ht 64.0 in | Wt 149.6 lb

## 2016-08-01 DIAGNOSIS — N83201 Unspecified ovarian cyst, right side: Secondary | ICD-10-CM | POA: Diagnosis not present

## 2016-08-01 NOTE — Progress Notes (Signed)
[place] scheduled May 2nd @ 0900.  Pt notified.

## 2016-08-01 NOTE — Progress Notes (Signed)
Subjective:     Patient ID: Andrea Singh, female   DOB: 12-28-1985, 31 y.o.   MRN: [id number] Cc f/u pelvic pain and [place] result  [id number] Patient's last menstrual period was 07/24/2016 (exact date). NO pain now and [place] was done 06/09/16 with showed possible right hemorrhagic ovarian cyst with recommendation to repeat 6-12 weeks   Review of Systems  Gastrointestinal: Negative.   Genitourinary: Negative for menstrual problem, pelvic pain and vaginal discharge.       Objective:   Physical Exam  Constitutional: She is oriented to person, place, and time. She appears well-developed.  Pulmonary/Chest: Effort normal.  Neurological: She is alert and oriented to person, place, and time.  Skin: Skin is warm and dry.  Psychiatric: She has a normal mood and affect. Her behavior is normal.   CLINICAL DATA:  Patient with pelvic pain and dysmenorrhea.  EXAM: TRANSABDOMINAL AND TRANSVAGINAL ULTRASOUND OF PELVIS  TECHNIQUE: Both transabdominal and transvaginal ultrasound examinations of the pelvis were performed. Transabdominal technique was performed for global imaging of the pelvis including uterus, ovaries, adnexal regions, and pelvic cul-de-sac. It was necessary to proceed with endovaginal exam following the transabdominal exam to visualize the adnexal structures.  COMPARISON:  Pelvic ultrasound 01/05/2012.  FINDINGS: Uterus  Measurements: 9.2 x 4.0 x 6.4 cm. No fibroids or other mass visualized.  Endometrium  Thickness: 7 mm.  No focal abnormality visualized.  Right ovary  Measurements: 5.2 x 3.8 x 4.4 cm. There is a 3.8 x 3.4 x 3.4 cm isoechoic mass within the right ovary.  Left ovary  Measurements: 3.9 x 2.6 x 2.7 cm. Normal appearance/no adnexal mass.  Other findings  No abnormal free fluid.  IMPRESSION: No acute process within the pelvis.  Isoechoic mass within the right ovary potentially representing a hemorrhagic cyst. Short-interval  follow up ultrasound in 6-12 weeks is recommended, preferably during the week following the patient's normal menses.   Electronically Signed   By: Annia Belt M.D.   On: 06/09/2016 09:58    Assessment:     Needs f/u to assure resolution of right ovarian cyst which is likely as she is asymptomatic    Plan:     Repeat US and notify her of the result

## 2016-08-16 ENCOUNTER — Ambulatory Visit (HOSPITAL_COMMUNITY): Admission: RE | Admit: 2016-08-16 | Payer: BLUE CROSS/BLUE SHIELD | Source: Ambulatory Visit

## 2016-11-10 ENCOUNTER — Emergency Department (HOSPITAL_COMMUNITY): Payer: BLUE CROSS/BLUE SHIELD

## 2016-11-10 ENCOUNTER — Encounter (HOSPITAL_COMMUNITY): Payer: Self-pay | Admitting: Emergency Medicine

## 2016-11-10 ENCOUNTER — Emergency Department (HOSPITAL_COMMUNITY)
Admission: EM | Admit: 2016-11-10 | Discharge: 2016-11-11 | Disposition: A | Discharge status: Home / Self Care | Payer: BLUE CROSS/BLUE SHIELD | Attending: Emergency Medicine | Admitting: Emergency Medicine

## 2016-11-10 DIAGNOSIS — I10 Essential (primary) hypertension: Secondary | ICD-10-CM | POA: Insufficient documentation

## 2016-11-10 DIAGNOSIS — Z79899 Other long term (current) drug therapy: Secondary | ICD-10-CM | POA: Diagnosis not present

## 2016-11-10 DIAGNOSIS — Y9389 Activity, other specified: Secondary | ICD-10-CM | POA: Diagnosis not present

## 2016-11-10 DIAGNOSIS — Y998 Other external cause status: Secondary | ICD-10-CM | POA: Diagnosis not present

## 2016-11-10 DIAGNOSIS — S51012A Laceration without foreign body of left elbow, initial encounter: Secondary | ICD-10-CM | POA: Diagnosis not present

## 2016-11-10 DIAGNOSIS — X58XXXA Exposure to other specified factors, initial encounter: Secondary | ICD-10-CM | POA: Insufficient documentation

## 2016-11-10 DIAGNOSIS — S41112A Laceration without foreign body of left upper arm, initial encounter: Secondary | ICD-10-CM | POA: Diagnosis present

## 2016-11-10 DIAGNOSIS — Y929 Unspecified place or not applicable: Secondary | ICD-10-CM | POA: Diagnosis not present

## 2016-11-10 MED ORDER — HYDROCODONE-ACETAMINOPHEN 5-325 MG PO TABS: 2.0000 | ORAL_TABLET | ORAL | 0 refills | Status: DC | PRN

## 2016-11-10 MED ORDER — LIDOCAINE HCL 2 % IJ SOLN
10.0000 mL | Freq: Once | INTRAMUSCULAR | Status: AC
Start: 2016-11-10 — End: 2016-11-10
  Administered 2016-11-10: 200 mg
  Filled 2016-11-10: qty 20

## 2016-11-10 MED ORDER — SULFAMETHOXAZOLE-TRIMETHOPRIM 800-160 MG PO TABS: 1.0000 | ORAL_TABLET | Freq: Two times a day (BID) | ORAL | 0 refills | Status: AC

## 2016-11-10 NOTE — ED Notes (Signed)
EDP at bedside suturing pt 

## 2016-11-10 NOTE — ED Triage Notes (Signed)
Pt reports falling and cutting L elbow on glass. approx 2 in laceration noted. Bleeding controlled. Unknown last tetanus shot.

## 2016-11-10 NOTE — ED Notes (Signed)
Pt states last tetanus shot was approx 3 years ago

## 2016-11-10 NOTE — Discharge Instructions (Signed)
Return if any problems.

## 2016-11-10 NOTE — ED Notes (Signed)
Suture cart at bedside 

## 2016-11-11 NOTE — ED Provider Notes (Signed)
[facility]-EMERGENCY DEPT Provider Note   CSN: [id number] Arrival date & time: 11/10/16  2139     History   Chief Complaint Chief Complaint  Patient presents with  . Laceration    HPI Andrea Singh is a 31 y.o. female.  The history is provided by the patient. No language interpreter was used.  Laceration   The incident occurred 3 to 5 hours ago. The laceration is located on the left arm. The laceration is 3 cm in size. The laceration mechanism is unknown.The pain is moderate. The pain has been constant since onset. She reports no foreign bodies present. Her tetanus status is UTD.   Pt complains of a laceration to her elbow.  Past Medical History:  Diagnosis Date  . Hypertension    with preg in 2008    Patient Active Problem List   Diagnosis Date Noted  . Ovarian cyst, right 05/31/2016    Past Surgical History:  Procedure Laterality Date  . NO PAST SURGERIES      OB History    Gravida Para Term Preterm AB Living   2 2 1 1   2    SAB TAB Ectopic Multiple Live Births           2       Home Medications    Prior to Admission medications   Medication Sig Start Date End Date Taking? Authorizing Provider  HYDROcodone-acetaminophen (NORCO/VICODIN) 5-325 MG tablet Take 2 tablets by mouth every 4 (four) hours as needed. 11/10/16   Elson AreasSofia, Leslie K, PA-C  Norgestimate-Ethinyl Estradiol Triphasic (TRI-SPRINTEC) 0.18/0.215/0.25 MG-35 MCG tablet Take 1 tablet by mouth daily. 05/31/16   Adam PhenixArnold, James G, MD  sulfamethoxazole-trimethoprim (BACTRIM DS,SEPTRA DS) 800-160 MG tablet Take 1 tablet by mouth 2 (two) times daily. 11/10/16 11/17/16  Elson AreasSofia, Leslie K, PA-C    Family History Family History  Problem Relation Age of Onset  . Diabetes Mother     Social History Social History  Substance Use Topics  . Smoking status: Never Smoker  . Smokeless tobacco: Never Used  . Alcohol use No     Allergies   Patient has no known allergies.   Review of Systems Review of Systems    All other systems reviewed and are negative.    Physical Exam Updated Vital Signs BP 139/85 (BP Location: Right Arm)   Pulse 92   Temp 98.3 F (36.8 C) (Oral)   Resp 18   Ht 5\' 4"  (1.626 m)   Wt 68 kg (150 lb)   SpO2 99%   BMI 25.75 kg/m   Physical Exam  Constitutional: She appears well-developed and well-nourished. No distress.  HENT:  Head: Normocephalic and atraumatic.  Eyes: Conjunctivae are normal.  Neck: Neck supple.  Cardiovascular: Normal rate.   No murmur heard. Pulmonary/Chest: Effort normal. No respiratory distress.  Abdominal: There is no tenderness.  Musculoskeletal: She exhibits no edema.  3cm laceration left elbow  Neurological: She is alert.  Skin: Skin is warm and dry.  Psychiatric: She has a normal mood and affect.  Nursing note and vitals reviewed.    ED Treatments / Results  Labs (all labs ordered are listed, but only abnormal results are displayed) Labs Reviewed - No data to display  EKG  EKG Interpretation None       Radiology Dg Elbow Complete Left  Result Date: 11/10/2016 CLINICAL DATA:  Recent fall with elbow laceration, initial encounter EXAM: LEFT ELBOW - COMPLETE 3+ VIEW COMPARISON:  None. FINDINGS: Soft tissue irregularity  is noted consistent with the given clinical history. No acute fracture is noted. No definitive radiopaque foreign body is seen. IMPRESSION: Soft tissue injury without bony abnormality. Electronically Signed   By: Alcide CleverMark  Lukens M.D.   On: 11/10/2016 22:02    Procedures .Marland Kitchen.Laceration Repair Date/Time: 11/11/2016 12:26 AM Performed by: Elson AreasSOFIA, LESLIE K Authorized by: Elson AreasSOFIA, LESLIE K   Consent:    Consent obtained:  Verbal   Consent given by:  Patient   Alternatives discussed:  No treatment Laceration details:    Location: elbow.   Length (cm):  3   Depth (mm):  4 Repair type:    Repair type:  Simple Pre-procedure details:    Preparation:  Patient was prepped and draped in usual sterile  fashion Exploration:    Wound exploration: wound explored through full range of motion     Contaminated: no   Treatment:    Area cleansed with:  Betadine   Amount of cleaning:  Standard   Irrigation solution:  Sterile water   Visualized foreign bodies/material removed: no   Skin repair:    Repair method:  Sutures   Suture size:  5-0   Number of sutures:  8 Approximation:    Approximation:  Loose   Vermilion border: well-aligned   Post-procedure details:    Dressing:  Open (no dressing)   Patient tolerance of procedure:  Tolerated with difficulty   (including critical care time)  Medications Ordered in ED Medications  lidocaine (XYLOCAINE) 2 % (with pres) injection 200 mg (200 mg Infiltration Given by Other 11/10/16 2328)     Initial Impression / Assessment and Plan / ED Course  I have reviewed the triage vital signs and the nursing notes.  Pertinent labs & imaging results that were available during my care of the patient were reviewed by me and considered in my medical decision making (see chart for details).     Suture removal 8 days  Final Clinical Impressions(s) / ED Diagnoses   Final diagnoses:  Laceration of left elbow, initial encounter    New Prescriptions New Prescriptions   HYDROCODONE-ACETAMINOPHEN (NORCO/VICODIN) 5-325 MG TABLET    Take 2 tablets by mouth every 4 (four) hours as needed.   SULFAMETHOXAZOLE-TRIMETHOPRIM (BACTRIM DS,SEPTRA DS) 800-160 MG TABLET    Take 1 tablet by mouth 2 (two) times daily.     Elson AreasSofia, Leslie K, [place]PA-C 11/11/16 [phone]    Rolan BuccoBelfi, Melanie, MD 11/11/16 [phone]

## 2016-11-18 ENCOUNTER — Emergency Department (HOSPITAL_COMMUNITY)
Admission: EM | Admit: 2016-11-18 | Discharge: 2016-11-18 | Disposition: A | Discharge status: Home / Self Care | Payer: BLUE CROSS/BLUE SHIELD | Attending: Emergency Medicine | Admitting: Emergency Medicine

## 2016-11-18 ENCOUNTER — Encounter (HOSPITAL_COMMUNITY): Payer: Self-pay | Admitting: *Deleted

## 2016-11-18 DIAGNOSIS — Z4802 Encounter for removal of sutures: Secondary | ICD-10-CM | POA: Insufficient documentation

## 2016-11-18 NOTE — Discharge Instructions (Signed)
Please follow up with your PCP for any persistent concerns or symptoms. If you develop worsening or new concerning symptoms you can return to the emergency department for re-evaluation.

## 2016-11-18 NOTE — ED Provider Notes (Signed)
[facility] Provider Note   CSN: [id number] Arrival date & time: 11/18/16  [id number]     History   Chief Complaint Chief Complaint  Patient presents with  . Suture / Staple Removal    HPI Andrea Singh is a 31 y.o. female who presents to the emergency department today for suture removal. Patient presented on 11/10/16 for 5 cm laceration to the left elbow and received 8 stitches and was discharged home on antibiotics. Patient's completed a course of antibiotics and has had no fever, chills, surrounding erythema, drainage, dehiscence or  other complaints  since discharge from the emergency department.  HPI  Past Medical History:  Diagnosis Date  . Hypertension    with preg in 2008    Patient Active Problem List   Diagnosis Date Noted  . Ovarian cyst, right 05/31/2016    Past Surgical History:  Procedure Laterality Date  . NO PAST SURGERIES      OB History    Gravida Para Term Preterm AB Living   2 2 1 1   2    SAB TAB Ectopic Multiple Live Births           2       Home Medications    Prior to Admission medications   Medication Sig Start Date End Date Taking? Authorizing Provider  HYDROcodone-acetaminophen (NORCO/VICODIN) 5-325 MG tablet Take 2 tablets by mouth every 4 (four) hours as needed. 11/10/16   Elson AreasSofia, Leslie K, PA-C  Norgestimate-Ethinyl Estradiol Triphasic (TRI-SPRINTEC) 0.18/0.215/0.25 MG-35 MCG tablet Take 1 tablet by mouth daily. 05/31/16   Adam PhenixArnold, James G, MD    Family History Family History  Problem Relation Age of Onset  . Diabetes Mother     Social History Social History  Substance Use Topics  . Smoking status: Never Smoker  . Smokeless tobacco: Never Used  . Alcohol use No     Allergies   Patient has no known allergies.   Review of Systems Review of Systems  Constitutional: Negative for fever.  Skin: Negative for color change.  Neurological: Negative for numbness.     Physical Exam Updated Vital Signs BP 118/70 (BP  Location: Right Arm)   Pulse 76   Temp 98.1 F (36.7 C) (Oral)   Resp 18   LMP 11/18/2016   SpO2 100%   Physical Exam  Constitutional: She appears well-developed and well-nourished.  HENT:  Head: Normocephalic and atraumatic.  Right Ear: External ear normal.  Left Ear: External ear normal.  Eyes: Conjunctivae are normal. Right eye exhibits no discharge. Left eye exhibits no discharge. No scleral icterus.  Pulmonary/Chest: Effort normal. No respiratory distress.  Musculoskeletal:       Left elbow: She exhibits normal range of motion and no swelling.  Neurovascularly intact distally. Compartments soft above and below affected joint.   Neurological: She is alert.  Skin: Skin is warm and dry. No pallor.  5 cm well healing laceration to the posterior left elbow. 8 stitches in place. No surrounding erythema, discharge. No fluctuance or induration.  Psychiatric: She has a normal mood and affect.  Nursing note and vitals reviewed.    ED Treatments / Results  Labs (all labs ordered are listed, but only abnormal results are displayed) Labs Reviewed - No data to display  EKG  EKG Interpretation None       Radiology No results found.  Procedures .Suture Removal Date/Time: 11/18/2016 9:48 AM Performed by: Jacinto HalimMACZIS, Mayford Alberg M Authorized by: Jacinto HalimMACZIS, Berna Gitto M   Consent:  Consent obtained:  Verbal   Consent given by:  Patient   Risks discussed:  Bleeding, pain and wound separation   Alternatives discussed:  No treatment and delayed treatment Location:    Location:  Upper extremity   Upper extremity location:  Elbow   Elbow location:  L elbow Procedure details:    Wound appearance:  No signs of infection and clean   Number of sutures removed:  8 Post-procedure details:    Post-removal:  Dressing applied   Patient tolerance of procedure:  Tolerated well, no immediate complications   (including critical care time)  Medications Ordered in ED Medications - No data to  display   Initial Impression / Assessment and Plan / ED Course  I have reviewed the triage vital signs and the nursing notes.  Pertinent labs & imaging results that were available during my care of the patient were reviewed by me and considered in my medical decision making (see chart for details).      Pt to ER for suture removal and wound check as above. Procedure tolerated well. Vitals normal, no signs of infection. Scar minimization & return precautions given at dc.    Final Clinical Impressions(s) / ED Diagnoses   Final diagnoses:  Visit for suture removal    New Prescriptions New Prescriptions   No medications on file     Princella PellegriniMaczis, Qadir Folks M, PA-C 11/18/16 [id number]    Gerhard MunchLockwood, Robert, MD 11/18/16 [phone]

## 2016-11-18 NOTE — ED Notes (Signed)
Sutures intact to left elbow from 7/27.Some redness noted around wound.

## 2016-11-18 NOTE — ED Triage Notes (Signed)
Pt is here for suture removal to left elbow, no complaints.

## 2017-05-15 ENCOUNTER — Encounter (HOSPITAL_COMMUNITY): Payer: Self-pay | Admitting: Emergency Medicine

## 2017-05-15 ENCOUNTER — Ambulatory Visit (HOSPITAL_COMMUNITY)
Admission: EM | Admit: 2017-05-15 | Discharge: 2017-05-15 | Disposition: A | Discharge status: Home / Self Care | Payer: BLUE CROSS/BLUE SHIELD | Attending: Family Medicine | Admitting: Family Medicine

## 2017-05-15 ENCOUNTER — Other Ambulatory Visit: Payer: Self-pay

## 2017-05-15 ENCOUNTER — Ambulatory Visit (INDEPENDENT_AMBULATORY_CARE_PROVIDER_SITE_OTHER): Payer: BLUE CROSS/BLUE SHIELD

## 2017-05-15 DIAGNOSIS — S99922A Unspecified injury of left foot, initial encounter: Secondary | ICD-10-CM

## 2017-05-15 DIAGNOSIS — S93602A Unspecified sprain of left foot, initial encounter: Secondary | ICD-10-CM

## 2017-05-15 MED ORDER — IBUPROFEN 800 MG PO TABS
ORAL_TABLET | ORAL | Status: AC
  Filled 2017-05-15: qty 1

## 2017-05-15 MED ORDER — IBUPROFEN 800 MG PO TABS: 800.0000 mg | ORAL_TABLET | Freq: Three times a day (TID) | ORAL | 0 refills | Status: AC

## 2017-05-15 MED ORDER — IBUPROFEN 800 MG PO TABS
800.0000 mg | ORAL_TABLET | Freq: Once | ORAL | Status: AC
  Administered 2017-05-15: 800 mg via ORAL

## 2017-05-15 NOTE — ED Provider Notes (Signed)
[facility]    CSN: [id number] Arrival date & time: 05/15/17  1002     History   Chief Complaint Chief Complaint  Patient presents with  . Foot Injury    HPI Andrea Singh is a 32 y.o. female.   [name] presents with complaints of left foot pain after she misstepped down three stairs at her home last night at approximately 2100. She was able to stand and ambulate after fall but pain worsened. Has been using crutches today due to pain. Without previous foot or ankle injury. Took tylenol last night but has not taken anything for pain currently. Pain is 8/10. To left dorsal and lateral foot. Occasional tingling sensation.   ROS per HPI.       Past Medical History:  Diagnosis Date  . Hypertension    with preg in 2008    Patient Active Problem List   Diagnosis Date Noted  . Ovarian cyst, right 05/31/2016    Past Surgical History:  Procedure Laterality Date  . NO PAST SURGERIES      OB History    Gravida Para Term Preterm AB Living   2 2 1 1   2    SAB TAB Ectopic Multiple Live Births           2       Home Medications    Prior to Admission medications   Medication Sig Start Date End Date Taking? Authorizing Provider  ibuprofen (ADVIL,MOTRIN) 800 MG tablet Take 1 tablet (800 mg total) by mouth 3 (three) times daily. 05/15/17   Georgetta Haber, NP  Norgestimate-Ethinyl Estradiol Triphasic (TRI-SPRINTEC) 0.18/0.215/0.25 MG-35 MCG tablet Take 1 tablet by mouth daily. 05/31/16   Adam Phenix, MD    Family History Family History  Problem Relation Age of Onset  . Diabetes Mother     Social History Social History   Tobacco Use  . Smoking status: Never Smoker  . Smokeless tobacco: Never Used  Substance Use Topics  . Alcohol use: No  . Drug use: No     Allergies   Patient has no known allergies.   Review of Systems Review of Systems   Physical Exam Triage Vital Signs ED Triage Vitals  Enc Vitals Group     BP 05/15/17 1039  122/73     Pulse Rate 05/15/17 1039 83     Resp --      Temp 05/15/17 1039 98.7 F (37.1 C)     Temp Source 05/15/17 1039 Oral     SpO2 05/15/17 1039 98 %     Weight --      Height --      Head Circumference --      Peak Flow --      Pain Score 05/15/17 1036 8     Pain Loc --      Pain Edu? --      Excl. in GC? --    No data found.  Updated Vital Signs BP 122/73 (BP Location: Right Arm)   Pulse 83   Temp 98.7 F (37.1 C) (Oral)   LMP 04/26/2017   SpO2 98%   Visual Acuity Right Eye Distance:   Left Eye Distance:   Bilateral Distance:    Right Eye Near:   Left Eye Near:    Bilateral Near:     Physical Exam  Constitutional: She is oriented to person, place, and time. She appears well-developed and well-nourished. No distress.  Cardiovascular: Normal rate, regular rhythm  and normal heart sounds.  Pulmonary/Chest: Effort normal and breath sounds normal.  Musculoskeletal:       Left foot: There is decreased range of motion, tenderness, bony tenderness and swelling. There is normal capillary refill, no crepitus, no deformity and no laceration.       Feet:  Tenderness to left dorsal and lateral foot, without malleolus tenderness; gross sensation intact; pain with passive ankle flexion, eversion and inversion to left lateral foot; cap refill <2 sec; strong pedal pulse   Neurological: She is alert and oriented to person, place, and time.  Skin: Skin is warm and dry.     UC Treatments / Results  Labs (all labs ordered are listed, but only abnormal results are displayed) Labs Reviewed - No data to display  EKG  EKG Interpretation None       Radiology Dg Foot Complete Left  Result Date: 05/15/2017 CLINICAL DATA:  Left foot injury. EXAM: LEFT FOOT - COMPLETE 3+ VIEW COMPARISON:  No recent prior. FINDINGS: There is no evidence of fracture or dislocation. There is no evidence of arthropathy or other focal bone abnormality. Soft tissues are unremarkable. IMPRESSION: No  acute abnormality identified. Electronically Signed   By: Maisie Fushomas  Register   On: 05/15/2017 10:54    Procedures Procedures (including critical care time)  Medications Ordered in UC Medications  ibuprofen (ADVIL,MOTRIN) tablet 800 mg (not administered)     Initial Impression / Assessment and Plan / UC Course  I have reviewed the triage vital signs and the nursing notes.  Pertinent labs & imaging results that were available during my care of the patient were reviewed by me and considered in my medical decision making (see chart for details).     RICE therapy, ibuprofen. Weight bearing as tolerated. Patient does work on her feet, recommended to continue to follow with [facility] if further work restrictions needed. Patient verbalized understanding and agreeable to plan.    Final Clinical Impressions(s) / UC Diagnoses   Final diagnoses:  Sprain of left foot, initial encounter    ED Discharge Orders        Ordered    ibuprofen (ADVIL,MOTRIN) 800 MG tablet  3 times daily     05/15/17 1103       Controlled Substance Prescriptions [place] Controlled Substance Registry consulted? Not Applicable   Georgetta HaberBurky, Natalie B, NP 05/15/17 1112

## 2017-05-15 NOTE — ED Notes (Signed)
Corrected address for follow up

## 2017-05-15 NOTE — ED Triage Notes (Addendum)
Pt states she was walking down her porch steps when she lost her footing and rolled her left ankle/foot.  Pt has swelling to the lateral and anterior side of her foot.  She is unable to bear weight on the foot.

## 2017-05-15 NOTE — Discharge Instructions (Addendum)
Elevate and apply ice to your foot ACE wrap for compression and support. Activity as tolerated. If you need additional work restrictions please follow [facility] at [facility]  Address: [address], [place], [place] [address]  Phone: [phone]  If no improvement in the next 2-4 weeks or worsening of symptoms return to be seen or follow with your primary care provider for further management.

## 2018-08-06 IMAGING — US US PELVIS COMPLETE
1 series · 15 of 25 positions shown · non-contrast
Comparison: Pelvic ultrasound 01/05/2012.

CLINICAL DATA: Patient with pelvic pain and dysmenorrhea.

EXAM:
TRANSABDOMINAL AND TRANSVAGINAL ULTRASOUND OF PELVIS
TECHNIQUE: Both transabdominal and transvaginal ultrasound examinations of the
pelvis were performed. Transabdominal technique was performed for
global imaging of the pelvis including uterus, ovaries, adnexal
regions, and pelvic cul-de-sac. It was necessary to proceed with
endovaginal exam following the transabdominal exam to visualize the
adnexal structures.

[Series 1: us pelvis complete · 15 of 49 slices shown]
[im 1/49]
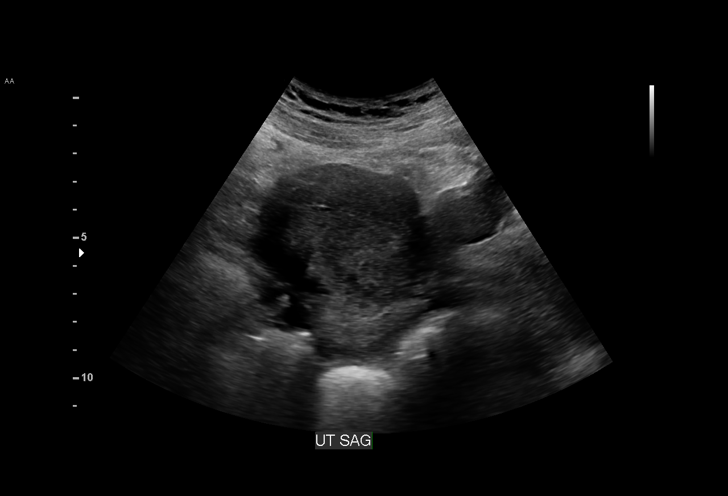
[im 5/49]
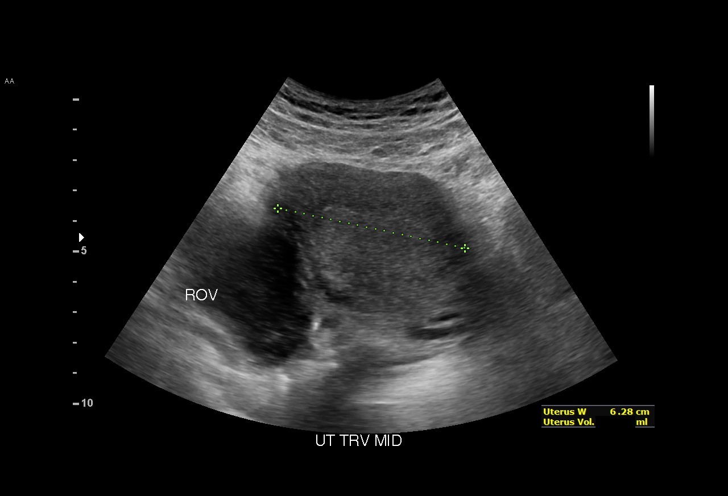
[im 9/49]
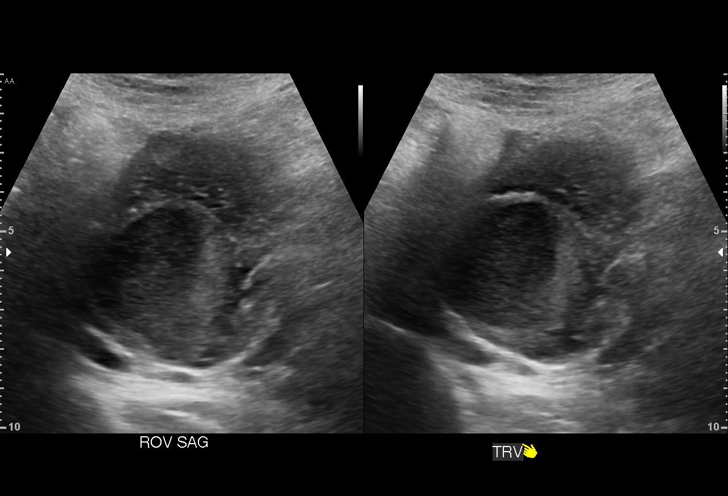
[im 11/49]
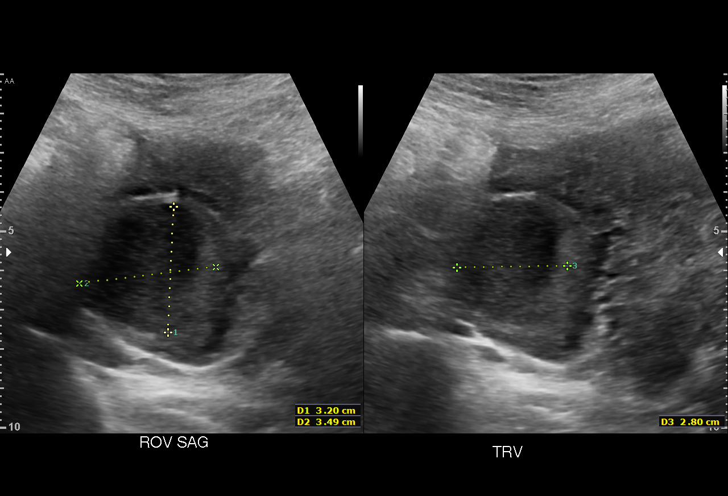
[im 15/49]
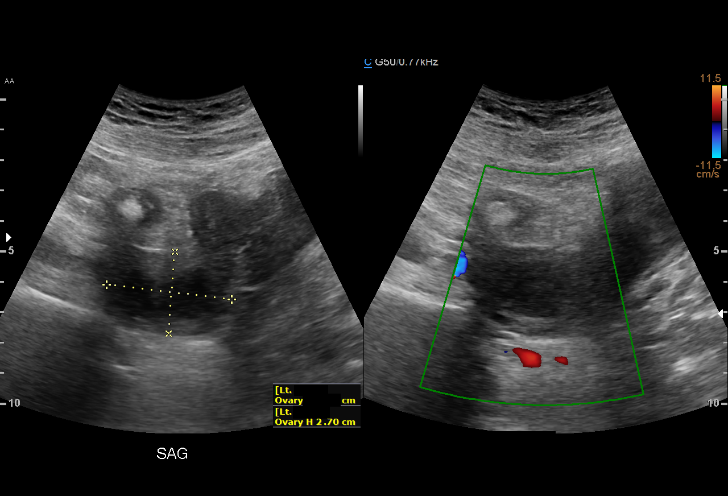
[im 19/49]
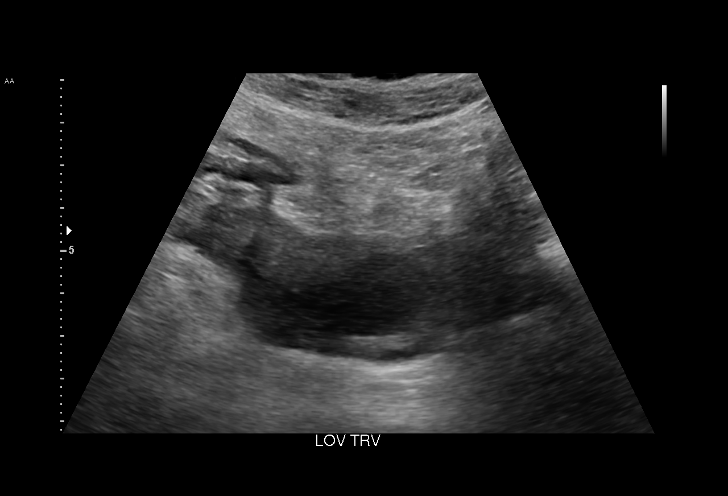
[im 21/49]
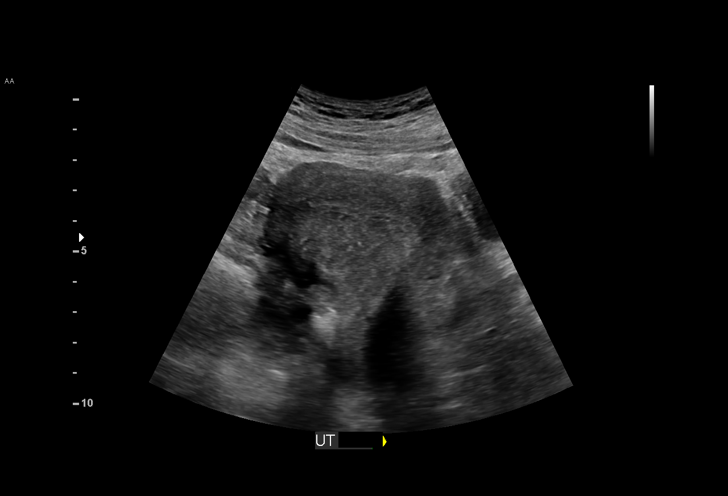
[im 25/49]
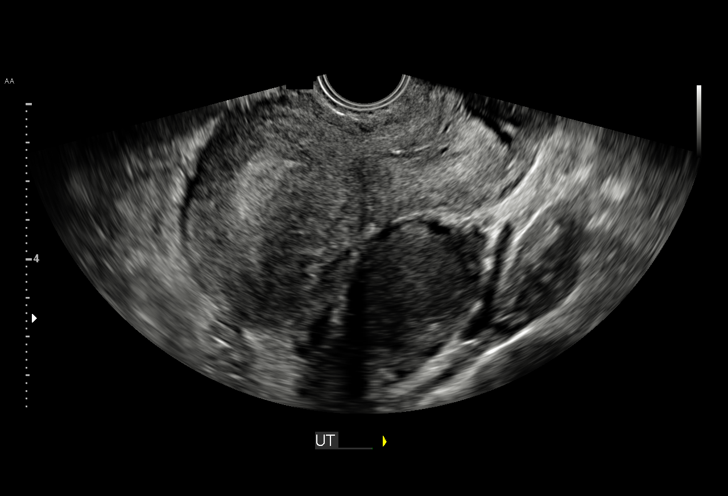
[im 29/49]
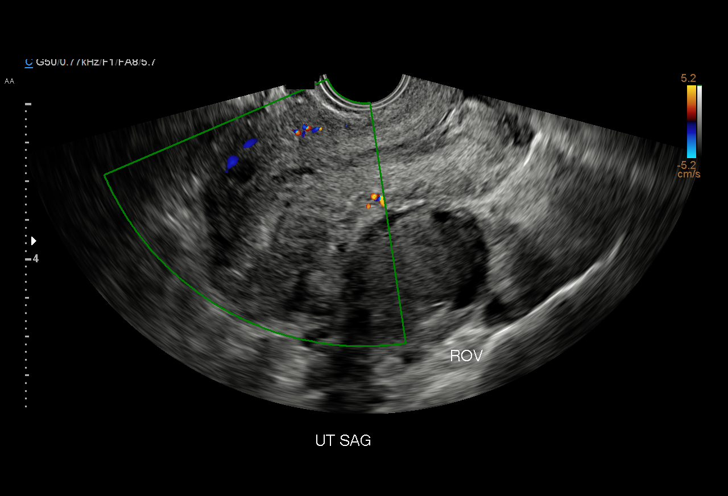
[im 31/49]
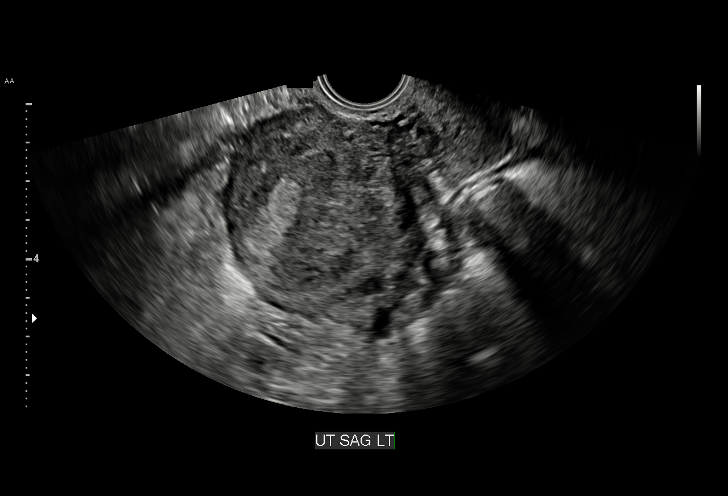
[im 35/49]
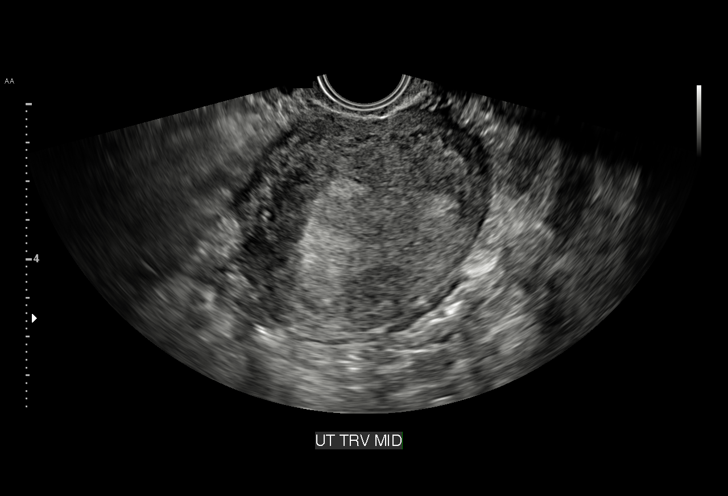
[im 39/49]
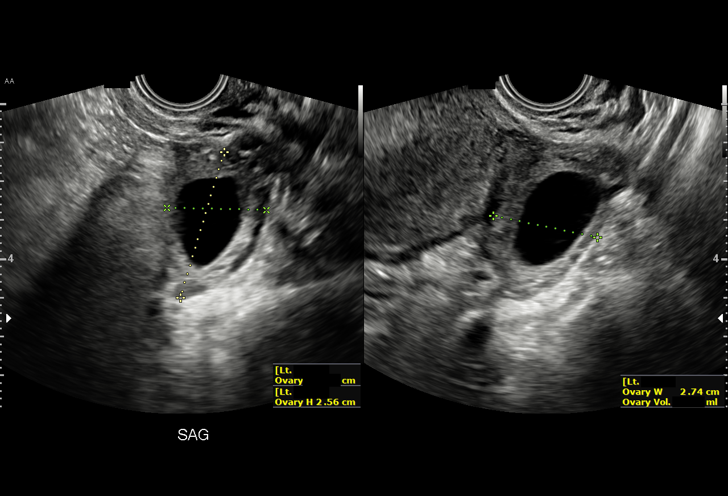
[im 41/49]
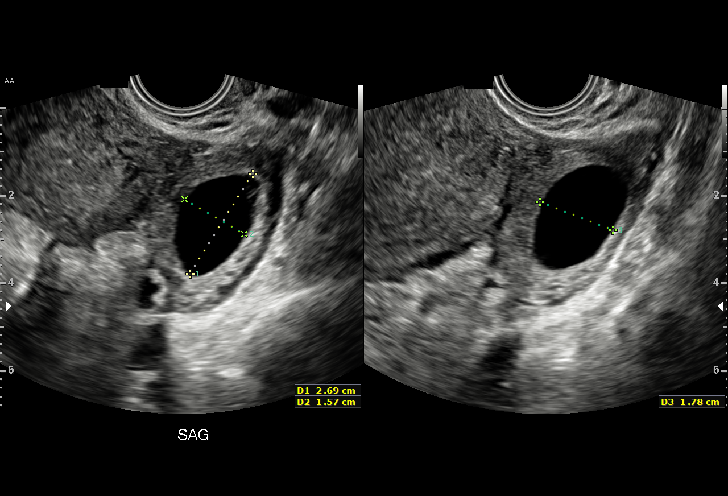
[im 45/49]
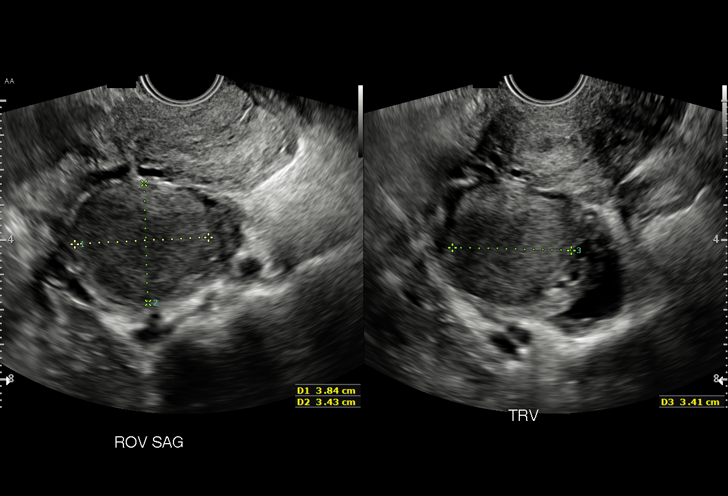
[im 49/49]
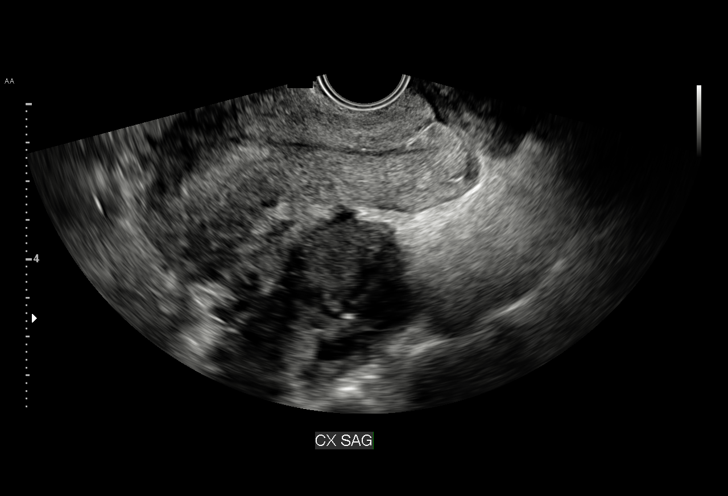

[15 of 25 positions shown; findings below may reference images not displayed]

FINDINGS: Uterus

Measurements: 9.2 x 4.0 x 6.4 cm. No fibroids or other mass
visualized.

Endometrium

Thickness: 7 mm.  No focal abnormality visualized.

Right ovary

Measurements: 5.2 x 3.8 x 4.4 cm. There is a 3.8 x 3.4 x 3.4 cm
isoechoic mass within the right ovary.

Left ovary

Measurements: 3.9 x 2.6 x 2.7 cm. Normal appearance/no adnexal mass.

Other findings

No abnormal free fluid.
IMPRESSION: No acute process within the pelvis.

Isoechoic mass within the right ovary potentially representing a
hemorrhagic cyst. Short-interval follow up ultrasound in 6-12 weeks
is recommended, preferably during the week following the patient's
normal menses.

## 2022-02-16 ENCOUNTER — Encounter: Payer: Self-pay | Admitting: *Deleted

## 2022-04-03 ENCOUNTER — Encounter: Payer: BLUE CROSS/BLUE SHIELD | Admitting: Obstetrics and Gynecology
